# Patient Record
Sex: Female | Born: 1965 | Race: Black or African American | Hispanic: No | Marital: Married | Smoking: Never smoker
Health system: Southern US, Community
[De-identification: ages and names within clinical notes are randomized; demographics above are authoritative.]

## PROBLEM LIST (undated history)

## (undated) DIAGNOSIS — R112 Nausea with vomiting, unspecified: Secondary | ICD-10-CM

## (undated) DIAGNOSIS — Z9889 Other specified postprocedural states: Secondary | ICD-10-CM

## (undated) DIAGNOSIS — J111 Influenza due to unidentified influenza virus with other respiratory manifestations: Secondary | ICD-10-CM

## (undated) DIAGNOSIS — R600 Localized edema: Secondary | ICD-10-CM

## (undated) DIAGNOSIS — I8390 Asymptomatic varicose veins of unspecified lower extremity: Secondary | ICD-10-CM

## (undated) DIAGNOSIS — K469 Unspecified abdominal hernia without obstruction or gangrene: Secondary | ICD-10-CM

## (undated) HISTORY — DX: Localized edema: R60.0

## (undated) HISTORY — PX: HERNIA REPAIR: SHX51

## (undated) HISTORY — DX: Unspecified abdominal hernia without obstruction or gangrene: K46.9

## (undated) HISTORY — DX: Asymptomatic varicose veins of unspecified lower extremity: I83.90

## (undated) HISTORY — DX: Influenza due to unidentified influenza virus with other respiratory manifestations: J11.1

---

## 1998-02-12 ENCOUNTER — Emergency Department (HOSPITAL_COMMUNITY): Admission: EM | Admit: 1998-02-12 | Discharge: 1998-02-12 | Payer: Self-pay | Admitting: Emergency Medicine

## 1998-03-26 ENCOUNTER — Inpatient Hospital Stay (HOSPITAL_COMMUNITY): Admission: AD | Admit: 1998-03-26 | Discharge: 1998-03-26 | Payer: Self-pay | Admitting: *Deleted

## 1998-04-23 ENCOUNTER — Ambulatory Visit (HOSPITAL_COMMUNITY): Admission: RE | Admit: 1998-04-23 | Discharge: 1998-04-23 | Payer: Self-pay | Admitting: Obstetrics & Gynecology

## 1998-07-08 ENCOUNTER — Ambulatory Visit (HOSPITAL_COMMUNITY): Admission: RE | Admit: 1998-07-08 | Discharge: 1998-07-08 | Payer: Self-pay | Admitting: *Deleted

## 1998-07-29 ENCOUNTER — Inpatient Hospital Stay (HOSPITAL_COMMUNITY): Admission: AD | Admit: 1998-07-29 | Discharge: 1998-07-29 | Payer: Self-pay | Admitting: Obstetrics

## 1998-08-02 ENCOUNTER — Inpatient Hospital Stay (HOSPITAL_COMMUNITY): Admission: AD | Admit: 1998-08-02 | Discharge: 1998-08-07 | Payer: Self-pay | Admitting: Obstetrics & Gynecology

## 1998-08-04 ENCOUNTER — Encounter (INDEPENDENT_AMBULATORY_CARE_PROVIDER_SITE_OTHER): Payer: Self-pay | Admitting: Specialist

## 1998-08-13 ENCOUNTER — Inpatient Hospital Stay (HOSPITAL_COMMUNITY): Admission: AD | Admit: 1998-08-13 | Discharge: 1998-08-13 | Payer: Self-pay | Admitting: Obstetrics & Gynecology

## 1998-10-01 ENCOUNTER — Encounter (HOSPITAL_BASED_OUTPATIENT_CLINIC_OR_DEPARTMENT_OTHER): Payer: Self-pay | Admitting: General Surgery

## 1998-10-05 ENCOUNTER — Ambulatory Visit (HOSPITAL_COMMUNITY): Admission: RE | Admit: 1998-10-05 | Discharge: 1998-10-06 | Payer: Self-pay | Admitting: General Surgery

## 2000-12-26 ENCOUNTER — Encounter: Payer: Self-pay | Admitting: *Deleted

## 2000-12-26 ENCOUNTER — Ambulatory Visit (HOSPITAL_COMMUNITY): Admission: RE | Admit: 2000-12-26 | Discharge: 2000-12-26 | Payer: Self-pay | Admitting: *Deleted

## 2001-01-23 ENCOUNTER — Encounter (HOSPITAL_COMMUNITY): Admission: AD | Admit: 2001-01-23 | Discharge: 2001-02-22 | Payer: Self-pay | Admitting: *Deleted

## 2001-02-13 ENCOUNTER — Encounter: Payer: Self-pay | Admitting: *Deleted

## 2001-03-06 ENCOUNTER — Encounter (HOSPITAL_COMMUNITY): Admission: AD | Admit: 2001-03-06 | Discharge: 2001-04-05 | Payer: Self-pay | Admitting: *Deleted

## 2001-03-31 ENCOUNTER — Inpatient Hospital Stay (HOSPITAL_COMMUNITY): Admission: AD | Admit: 2001-03-31 | Discharge: 2001-03-31 | Payer: Self-pay | Admitting: *Deleted

## 2001-04-06 ENCOUNTER — Encounter (HOSPITAL_COMMUNITY): Admission: RE | Admit: 2001-04-06 | Discharge: 2001-05-06 | Payer: Self-pay | Admitting: *Deleted

## 2001-05-01 ENCOUNTER — Encounter: Payer: Self-pay | Admitting: *Deleted

## 2001-05-15 ENCOUNTER — Encounter (HOSPITAL_COMMUNITY): Admission: RE | Admit: 2001-05-15 | Discharge: 2001-06-14 | Payer: Self-pay | Admitting: *Deleted

## 2001-06-19 ENCOUNTER — Encounter (HOSPITAL_COMMUNITY): Admission: RE | Admit: 2001-06-19 | Discharge: 2001-06-29 | Payer: Self-pay | Admitting: *Deleted

## 2001-06-28 ENCOUNTER — Encounter: Payer: Self-pay | Admitting: *Deleted

## 2001-06-29 ENCOUNTER — Inpatient Hospital Stay (HOSPITAL_COMMUNITY): Admission: AD | Admit: 2001-06-29 | Discharge: 2001-07-02 | Payer: Self-pay | Admitting: *Deleted

## 2001-06-29 ENCOUNTER — Encounter (INDEPENDENT_AMBULATORY_CARE_PROVIDER_SITE_OTHER): Payer: Self-pay | Admitting: *Deleted

## 2001-07-05 ENCOUNTER — Inpatient Hospital Stay (HOSPITAL_COMMUNITY): Admission: AD | Admit: 2001-07-05 | Discharge: 2001-07-05 | Payer: Self-pay | Admitting: *Deleted

## 2001-08-21 ENCOUNTER — Inpatient Hospital Stay (HOSPITAL_COMMUNITY): Admission: AD | Admit: 2001-08-21 | Discharge: 2001-08-21 | Payer: Self-pay | Admitting: *Deleted

## 2001-08-21 ENCOUNTER — Encounter (INDEPENDENT_AMBULATORY_CARE_PROVIDER_SITE_OTHER): Payer: Self-pay | Admitting: *Deleted

## 2004-01-27 ENCOUNTER — Ambulatory Visit: Payer: Self-pay | Admitting: Nurse Practitioner

## 2004-03-15 ENCOUNTER — Ambulatory Visit: Payer: Self-pay | Admitting: Nurse Practitioner

## 2004-03-19 ENCOUNTER — Ambulatory Visit (HOSPITAL_COMMUNITY): Admission: RE | Admit: 2004-03-19 | Discharge: 2004-03-19 | Payer: Self-pay | Admitting: Internal Medicine

## 2004-03-24 ENCOUNTER — Ambulatory Visit (HOSPITAL_COMMUNITY): Admission: RE | Admit: 2004-03-24 | Discharge: 2004-03-24 | Payer: Self-pay | Admitting: Internal Medicine

## 2004-04-06 ENCOUNTER — Ambulatory Visit: Payer: Self-pay | Admitting: Nurse Practitioner

## 2004-04-12 ENCOUNTER — Ambulatory Visit (HOSPITAL_COMMUNITY): Admission: RE | Admit: 2004-04-12 | Discharge: 2004-04-12 | Payer: Self-pay | Admitting: Internal Medicine

## 2004-04-27 ENCOUNTER — Ambulatory Visit: Payer: Self-pay | Admitting: Nurse Practitioner

## 2004-05-03 ENCOUNTER — Ambulatory Visit: Payer: Self-pay | Admitting: *Deleted

## 2004-07-27 ENCOUNTER — Ambulatory Visit: Payer: Self-pay | Admitting: Nurse Practitioner

## 2005-07-01 ENCOUNTER — Ambulatory Visit: Payer: Self-pay | Admitting: Nurse Practitioner

## 2005-11-08 ENCOUNTER — Ambulatory Visit: Payer: Self-pay | Admitting: Nurse Practitioner

## 2005-11-08 LAB — CONVERTED CEMR LAB
RBC count: 4.49 10*6/uL
TSH: 1.31 microintl units/mL
WBC, blood: 5.1 10*3/uL

## 2005-11-15 ENCOUNTER — Ambulatory Visit: Payer: Self-pay | Admitting: Nurse Practitioner

## 2005-11-15 LAB — CONVERTED CEMR LAB: Pap Smear: NORMAL

## 2005-11-30 ENCOUNTER — Ambulatory Visit (HOSPITAL_COMMUNITY): Admission: RE | Admit: 2005-11-30 | Discharge: 2005-11-30 | Payer: Self-pay | Admitting: Family Medicine

## 2005-12-28 ENCOUNTER — Ambulatory Visit (HOSPITAL_COMMUNITY): Admission: RE | Admit: 2005-12-28 | Discharge: 2005-12-30 | Payer: Self-pay

## 2006-09-26 ENCOUNTER — Encounter: Payer: Self-pay | Admitting: Nurse Practitioner

## 2006-09-26 DIAGNOSIS — K432 Incisional hernia without obstruction or gangrene: Secondary | ICD-10-CM | POA: Insufficient documentation

## 2006-09-26 DIAGNOSIS — I8390 Asymptomatic varicose veins of unspecified lower extremity: Secondary | ICD-10-CM | POA: Insufficient documentation

## 2006-09-26 DIAGNOSIS — E785 Hyperlipidemia, unspecified: Secondary | ICD-10-CM | POA: Insufficient documentation

## 2006-10-25 ENCOUNTER — Encounter (INDEPENDENT_AMBULATORY_CARE_PROVIDER_SITE_OTHER): Payer: Self-pay | Admitting: *Deleted

## 2006-11-22 ENCOUNTER — Ambulatory Visit (HOSPITAL_COMMUNITY): Admission: RE | Admit: 2006-11-22 | Discharge: 2006-11-22 | Payer: Self-pay | Admitting: Internal Medicine

## 2006-11-22 ENCOUNTER — Ambulatory Visit: Payer: Self-pay | Admitting: Internal Medicine

## 2006-11-22 LAB — CONVERTED CEMR LAB
Chloride: 108 meq/L (ref 96–112)
Creatinine, Ser: 0.71 mg/dL (ref 0.40–1.20)
HDL: 56 mg/dL (ref >39)
Potassium: 3.9 meq/L (ref 3.5–5.3)
Sodium: 139 meq/L (ref 135–145)
Total CHOL/HDL Ratio: 3.1
Triglycerides: 114 mg/dL (ref <150)
VLDL: 23 mg/dL (ref 0–40)

## 2006-12-07 ENCOUNTER — Ambulatory Visit: Payer: Self-pay | Admitting: Internal Medicine

## 2006-12-18 ENCOUNTER — Ambulatory Visit (HOSPITAL_COMMUNITY): Admission: RE | Admit: 2006-12-18 | Discharge: 2006-12-18 | Payer: Self-pay | Admitting: Family Medicine

## 2007-04-27 ENCOUNTER — Ambulatory Visit: Payer: Self-pay | Admitting: Family Medicine

## 2007-06-26 ENCOUNTER — Ambulatory Visit: Payer: Self-pay | Admitting: Family Medicine

## 2010-06-25 NOTE — Discharge Summary (Signed)
[facility]  Patient:    Marcia Carter, TOPHAM Visit Number: [id number] MRN: [id number]          Service Type: OBS Location: 910A 9138 01 Attending Physician:  Michaelle Copas Dictated by:   Ed Blalock. Burnadette Peter, M.D. Admit Date:  06/29/2001 Discharge Date: 07/02/2001   CC:         Luisa Hart L. Lurene Shadow, M.D.  [facility]   Discharge Summary  DATE OF BIRTH:                1965-11-03  HISTORY OF PRESENT ILLNESS:   This 45 year old G4, P3-0-0-3 presented at 36-6/[redacted] weeks gestation, scheduled for repeat C-section and BTL.  She had a routine pregnancy with no complications.  MEDICATIONS:                  Prenatal vitamins.  ALLERGIES:                    Penicillin.  PAST MEDICAL HISTORY:         1. Previous C-section.                               2. Ventral hernia repair in August 2000.  REVIEW OF SYSTEMS:            Headaches and venous insufficiency in the right leg.  FAMILY HISTORY:               Significant for diabetes mellitus.  PHYSICAL EXAMINATION:  VITAL SIGNS:                  Stable and afebrile.  GENERAL:                      Normal exam with no abnormalities noted. Vaginal exam was deferred due to scheduled C-section.  HOSPITAL COURSE:              The patient underwent C-section for scheduled repeat.  She also underwent bilateral tubal ligation at request.  TXI and Medicaid papers on the chart at that time.  C-section was performed by Dr. Perlie Gold and Dr. Gavin Potters with ventral hernia repair by Dr. Lurene Shadow.  She was delivered of a viable female with Apgars of 9 and 9.  She had a routine postoperative course and, on postoperative day #3, was discharged to home with routine follow-up instructions.  DISCHARGE ACTIVITY:           Pelvic rest and ad lib.  DISCHARGE MEDICATIONS:        1. Percocet 5/325, one p.o. q.4h. p.r.n. pain.                               2. Ibuprofen 600 mg, one p.o. q.6h. p.r.n. pain.                  3. Prenatal vitamins, one p.o. q.d.                               4. Over-the-counter stool softener, one p.o.                                  b.i.d. p.r.n.  DISCHARGE FOLLOW UP:  She is to be seen in the maternity admissions unit on Thursday, Jul 05, 2001 for re-evaluation of her incision and staple removal.  She is to be seen within one week at Dr. Levi Aland office.  The number was provided.  She is to call for an appointment.  She is to be seen in six week in follow up for [facility]  CONDITION ON DISCHARGE:       Improved.  DISCHARGE DIAGNOSES:          1. Intrauterine pregnancy at term.                               2. Repeat elective cesarean section.                               3. Bilateral tubal ligation.                               4. Routine postoperative care.                               5. Ventral hernia repair with routine                                  postoperative care. Dictated by:   Ed Blalock. Burnadette Peter, M.D. Attending Physician:  Michaelle Copas DD:  07/02/01 TD:  07/04/01 Job: [id number]

## 2010-06-25 NOTE — Op Note (Signed)
Marcia Carter, Marcia Carter              ACCOUNT NO.:  [id number]   MEDICAL RECORD NO.:  [id number]          PATIENT TYPE:  OIB   LOCATION:  5735                         FACILITY:  [facility]   PHYSICIAN:  Lebron Conners, M.D.   DATE OF BIRTH:  1965/10/17   DATE OF PROCEDURE:  12/28/2005  DATE OF DISCHARGE:                                 OPERATIVE REPORT   PREOPERATIVE DIAGNOSIS:  Incisional hernia.   POSTOPERATIVE DIAGNOSIS:  Incisional hernia.   PROCEDURE PERFORMED:  Laparoscopic repair of incisional hernia.   SURGEON:  Lebron Conners, M.D.   ANESTHESIA:  General.   DESCRIPTION OF PROCEDURE:  After the patient was monitored and asleep and  had a Foley catheter in place and routine preparation and draping of the  abdomen, I made about a 3 cm incision in the left upper quadrant far  laterally near the rib margin and dissected down through the fat to the  muscles, and I cut the muscles and spread the muscles down to the  transversalis fascia and peritoneum, and I carefully entered the abdomen  sharply and dilated the track with my finger.  I placed a 0 Vicryl  pursestring suture in the fascia and secured a Hasson cannula and then  inflated the abdomen with carbon dioxide.  I put in a scope and saw  adhesions and a hernia in the midline as expected.  The remainder of the  abdominal cavity was generally free of adhesions, and I saw no other  abnormalities.   I then put in another 10 mm port and a 5 mm port along the left side,  putting them in under direct view and assuring that I made no injury to the  viscera when I put them in.  I thin took down the adhesions and reduced the  hernia with cautery and sharp dissection and traction.  There was a great  deal of omentum trapped in the hernias.  There were multiple hernias; I  encountered at least four defects along the midline, the lowest being at the  umbilicus, and several cephalad to that.  There was some small intestine  adherent to the  anterior abdominal wall, but not in the hernia sacs.  After  taking down all the adhesions and hernia, I very carefully inspected the  abdominal contents and I saw no evident injury to any bowel or any  persistent bleeding.  All of the omentum that was pulled out appeared to be  viable.   I then measured on the abdominal wall the area necessary to cover all the  defects and selected a piece of Parietex mesh with a slick backing measuring  20 x 30 cm.  I trimmed it slightly to make it oval, and then placed seven  tacking sutures in it, and then wet the mesh.  I marked it for orientation  and rolled it, and put it into the abdomen.  I then unrolled it and it lay  quite nicely in the orientation I desired.  I then used a suture capturer to  pull through each of the seven tacking sutures and tied them  all, and it  brought the mesh up nicely to the anterior abdominal wall with minimal  redundancy.  I then used a Pro-Tacker to further secure the mesh all the  away around and toward the middle as well to further reduce the redundancy  and provide secure closure all the way around the outside so that no hernia  could occur at the edges.  I felt that the mesh covered all of the hernia  defects very widely and was securely in place so that it was an adequate  repair.  I again checked the viscera and saw no problems.   I removed the extra ports that I had put in on the left side under direct  view, and removed the large port in the left lower quadrant under direct  view.  Then I allowed the carbon dioxide to escape and removed the Hasson  cannula and tied the pursestring suture.  I did a little further  reinforcement of the closure of the fascia and that incision, and  then I was satisfied that no hernia would occur there.  Sponge, needle and  instrument counts were correct.  Blood loss was minimal.  I closed the skin  incisions with intracuticular 4-0 Vicryl and Steri-Strips; and applied a   compressive bandage.  The patient tolerated the operation well.      Lebron Conners, M.D.  Electronically Signed     WB/MEDQ  D:  12/28/2005  T:  12/29/2005  Job:  [id number]

## 2010-06-25 NOTE — Op Note (Signed)
[facility]  Patient:    Marcia Carter, Marcia Carter Visit Number: [id number] MRN: [id number]          Service Type: OBS Location: 910A 9138 01 Attending Physician:  Michaelle Copas Dictated by:   Mardene Celeste. Lurene Shadow, M.D. Proc. Date: 06/29/01 Admit Date:  06/29/2001 Discharge Date: 07/02/2001   CC:         Luisa Hart L. Lurene Shadow, M.D. (2 copies)   Operative Report  PREOPERATIVE DIAGNOSIS:       Recurrent ventral hernia.  POSTOPERATIVE DIAGNOSIS:      Recurrent ventral hernia.  OPERATION:                    Repair of ventral hernia with mesh.  SURGEON:                      Mardene Celeste. Lurene Shadow, M.D.  ASSISTANT:                    Clement Husbands, M.D.  ANESTHESIA:                   Spinal.  INDICATIONS:                  This patient is a 45 year old [place] woman who is in the operating room today for an elective repeat cesarean section and for repair of a recurrent ventral hernia.  The patient had full knowledge of her surgery. The risks and benefits have been gone over with her in detail, and she has given consent to ventral hernia repair.  DESCRIPTION OF PROCEDURE:     At the time I entered the operating room, the patient wass having her C section completed, and I wa able to palpate the defect which was up high in the epigastrium from the C section wound.  I determined that an upper abdominal incision would be necessary to repair this. After C section was completed, I then removed the drapes, reprepped the abdomen.  An upper midline incision was then made through the old scar cicatrix,  deepened this through the skin and subcutaneous tissue, carrying dissection down to the hernia defect.  The hernial sac was opened and amputated.  There were adhesions around the defect, and portions of omentum and falciform ligament were taken down between clamps and secured with ties of silk.  I then closed the peritoneum overlying the defect with a  running suture of 2-0 Vicryl.  I then undermined the surrounding tissues so as to be able to put an onlay patch of mesh overlying the entire area of weakness.  This was sewn in with interrupted sutures of 0 Novofil in mattress fashion.  At the end of this, the defect was noted to be well repaired.  Sponge, instrument, and sharp counts were verified.  The subcutaneous tissue was closed with a running 3-0 Vicryl suture, skin closed with a running 4-0 Monocryl suture and then reinforced with Steri-Strips.  Sterile dressings applied.  Anesthetic was reversed, patient removed from the operating room to the recovery room in stable condition.  She tolerated the procedure well. Dictated by:   Mardene Celeste. Lurene Shadow, M.D. Attending Physician:  Michaelle Copas DD:  06/29/01 TD:  07/02/01 Job: [id number]

## 2010-06-25 NOTE — Op Note (Signed)
[facility]  Patient:    Marcia Carter, Marcia Carter Visit Number: [id number] MRN: [id number]          Service Type: OBS Location: 910A 9138 01 Attending Physician:  Michaelle Copas Dictated by:   Clement Husbands, M.D. Proc. Date: 06/29/01 Admit Date:  06/29/2001 Discharge Date: 07/02/2001                             Operative Report  PREOPERATIVE DIAGNOSES:       1. Repeat cesarean section.                               2. Requested sterilization.                               3. Ventral abdominal hernia.  POSTOPERATIVE DIAGNOSES:      1. Repeat cesarean section.                               2. Requested sterilization.                               3. Ventral abdominal hernia.  OPERATION:                   1. Repeat low transverse cervical cesarean                                  section.                               2. Bilateral tubal ligation with partial                                  salpingectomy.                               3. Repair of ventral hernia.  ASSISTANT:                    Clement Husbands, M.D.                               Conni Elliot, M.D.                                [name], M.D. repaired the hernia                               and will dictate separately.  ANESTHESIA:                   Spinal.  DESCRIPTION OF PROCEDURE:     With the patient under satisfactory spinal anesthesia, the abdomen was prepped, bladder catheterized, abdomen prepped and draped.  Low abdominal transverse skin incision was made and carried  down through the thick subcutaneous tissue to the rectus fascia which was transversely divided.  The peritoneal cavity was entered.  The vesicouterine peritoneum was transversely incised.  Because the patient had not been in labor, the lower anterior segment was quite thick.  Sharp dissection about two-thirds of the way through was done.  There was brisk bleeding at  that point, so my index finger was bluntly pushed on into the uterine cavity, and uterine incision extended laterally.  The patient was delivered of a female infant in satisfactory condition.  I do not know the Apgars at this time or the weight.  Cord was doubly clamped and divided.  The infant was shown to the parents and then passed on to pediatricians.  The bleeding sites on the edges of the incision were clamped with ring forceps.  The placenta was manually removed.  The uterus was manually explored and was negative.  The internal os was dilated.  Intravenous Pitocin drip was running.  Pitocin was also injected into the myometrium.  The uterine incision was closed with two segments of running 0 Vicryl suture. A second layer imbricated the first.  Additional bleeding sites required figure-of-eight suturing.  Suture line was irrigated.  Hemostasis was good. Vesicouterine peritoneum was approximated with running 2-0 Vicryl suture.  The site was irrigated.  Both fallopian tubes were then visualized.  The mid portion of both fallopian tubes were doubly ligated with 0 chromic catgut and then ligated segment excised.  The uterus, which had been left out of the abdominal cavity, was replaced.  Both tubal sites were reinspected.  Lower area was inspected. Hemostasis was good.  The anterior peritoneum was closed with a running 0 Vicryl suture.  Rectus fascia closed with running 2-0 Vicryl suture using two segments.  Hemostasis was good.  Skin edges were approximated with wide skin staples.  Measured blood loss was at least 1000 cc.  Sponge and needle instrument counts were correct.  The patient tolerated the procedure well.  As mentioned above, Dr. Lurene Shadow will dictate his part of the procedure. Dictated by:   Clement Husbands, M.D. Attending Physician:  Michaelle Copas DD:  06/29/01 TD:  07/02/01 Job: [id number]

## 2015-05-27 ENCOUNTER — Other Ambulatory Visit: Payer: Self-pay | Admitting: *Deleted

## 2015-05-27 ENCOUNTER — Encounter: Payer: Self-pay | Admitting: Vascular Surgery

## 2015-05-27 DIAGNOSIS — I83893 Varicose veins of bilateral lower extremities with other complications: Secondary | ICD-10-CM

## 2015-06-02 ENCOUNTER — Encounter: Payer: Self-pay | Admitting: Surgery

## 2015-06-02 ENCOUNTER — Other Ambulatory Visit: Payer: Self-pay | Admitting: Surgery

## 2015-06-02 NOTE — H&P (Signed)
Marcia Carter 06/02/2015 11:29 AM Location: [facility] Patient #: [id number] DOB: 03/10/1965 Married / Language: English / Race: Black or African American Female  History of Present Illness Ardeth Sportsman MD; 06/02/2015 12:11 PM) The patient is a 50 year old female who presents with an incisional hernia. Note for "Incisional hernia": Patient comes for surgical consultation at the request of her primary care physician, Dr. Boneta Lucks, [facility]. Concern for recurrent abdominal hernia  Very pleasant woman. Both our children go to the same elementary school. 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 Last C/S in 2000. Primarily repaired. Recurrent. Open repair with mesh 2003 with hysterectomy. Recurred. Laparoscopic underlay repair with Parietex mesh 2007. Did rather well. No episodes of obstruction. No nausea or vomiting. Mild constipation usually controlled with high fiber diet. Portion patient had episode of severe coughing with a cold. Congestion and bronchitis. She does not smoke. Eventually improved. However she noted some bulging in now discomfort on the left side of her abdomen. Not in the midline as before. However symptoms and bulging suspicious for another hernia. She was concerned. Primary care physician concerning for recurrent hernia. Therefore surgical consultation requested.   Past Surgical History Ardeth Sportsman, MD; 06/02/2015 11:40 AM) Ventral Hernia Repair 12/28/2005 Dr Marcy Panning. Parietex 30x20cm Ventral / Umbilical Hernia Surgery 07/02/2001 open VWH repair w mesh. Dr Dewaine Conger / Inguinal Hernia Surgery - Multiple times  Allergies Fay Records, CMA; 06/02/2015 11:29 AM) No Known Drug Allergies 06/02/2015  Medication History Fay Records, CMA; 06/02/2015 11:29 AM) No Current Medications Medications Reconciled  Travel History Ardeth Sportsman, MD;  06/02/2015 12:08 PM) originally from [place]. Has lived in [place] several decades.     Review of Systems Fay Records CMA; 06/02/2015 11:29 AM) General Not Present- Appetite Loss, Chills, Fatigue, Fever, Night Sweats, Weight Gain and Weight Loss. Skin Not Present- Change in Wart/Mole, Dryness, Hives, Jaundice, New Lesions, Non-Healing Wounds, Rash and Ulcer. HEENT Not Present- Earache, Hearing Loss, Hoarseness, Nose Bleed, Oral Ulcers, Ringing in the Ears, Seasonal Allergies, Sinus Pain, Sore Throat, Visual Disturbances, Wears glasses/contact lenses and Yellow Eyes. Cardiovascular Not Present- Chest Pain, Difficulty Breathing Lying Down, Leg Cramps, Palpitations, Rapid Heart Rate, Shortness of Breath and Swelling of Extremities. Gastrointestinal Not Present- Abdominal Pain, Bloating, Bloody Stool, Change in Bowel Habits, Chronic diarrhea, Constipation, Difficulty Swallowing, Excessive gas, Gets full quickly at meals, Hemorrhoids, Indigestion, Nausea, Rectal Pain and Vomiting. Female Genitourinary Not Present- Frequency, Nocturia, Painful Urination, Pelvic Pain and Urgency. Musculoskeletal Present- Swelling of Extremities. Not Present- Back Pain, Joint Pain, Joint Stiffness, Muscle Pain and Muscle Weakness. Neurological Present- Numbness. Not Present- Decreased Memory, Fainting, Headaches, Seizures, Tingling, Tremor, Trouble walking and Weakness. Psychiatric Not Present- Anxiety, Bipolar, Change in Sleep Pattern, Depression, Fearful and Frequent crying. Hematology Not Present- Easy Bruising, Excessive bleeding, Gland problems, HIV and Persistent Infections.  Vitals Fay Records CMA; 06/02/2015 11:30 AM) 06/02/2015 11:29 AM Weight: 198.8 lb Height: 64in Body Surface Area: 1.95 m Body Mass Index: 34.12 kg/m  Temp.: 98.67F  Pulse: 74 (Regular)  BP: 126/84 (Sitting, Left Arm, Standard)      Physical Exam Ardeth Sportsman MD; 06/02/2015 12:06 PM)  General Mental  Status-Alert. General Appearance-Not in acute distress, Not Sickly. Orientation-Oriented X3. Hydration-Well hydrated. Voice-Normal.  Integumentary Global Assessment Upon inspection and palpation of skin surfaces of the - Axillae: non-tender, no inflammation or ulceration, no drainage. and Distribution of scalp and body  hair is normal. General Characteristics Temperature - normal warmth is noted.  Head and Neck Head-normocephalic, atraumatic with no lesions or palpable masses. Face Global Assessment - atraumatic, no absence of expression. Neck Global Assessment - no abnormal movements, no bruit auscultated on the right, no bruit auscultated on the left, no decreased range of motion, non-tender. Trachea-midline. Thyroid Gland Characteristics - non-tender.  Eye Eyeball - Left-Extraocular movements intact, No Nystagmus. Eyeball - Right-Extraocular movements intact, No Nystagmus. Cornea - Left-No Hazy. Cornea - Right-No Hazy. Sclera/Conjunctiva - Left-No scleral icterus, No Discharge. Sclera/Conjunctiva - Right-No scleral icterus, No Discharge. Pupil - Left-Direct reaction to light normal. Pupil - Right-Direct reaction to light normal.  ENMT Ears Pinna - Left - no drainage observed, no generalized tenderness observed. Right - no drainage observed, no generalized tenderness observed. Nose and Sinuses External Inspection of the Nose - no destructive lesion observed. Inspection of the nares - Left - quiet respiration. Right - quiet respiration. Mouth and Throat Lips - Upper Lip - no fissures observed, no pallor noted. Lower Lip - no fissures observed, no pallor noted. Nasopharynx - no discharge present. Oral Cavity/Oropharynx - Tongue - no dryness observed. Oral Mucosa - no cyanosis observed. Hypopharynx - no evidence of airway distress observed.  Chest and Lung Exam Inspection Movements - Normal and Symmetrical. Accessory muscles - No use of accessory  muscles in breathing. Palpation Palpation of the chest reveals - Non-tender. Auscultation Breath sounds - Normal and Clear.  Cardiovascular Auscultation Rhythm - Regular. Murmurs & Other Heart Sounds - Auscultation of the heart reveals - No Murmurs and No Systolic Clicks.  Abdomen Inspection Inspection of the abdomen reveals - No Visible peristalsis and No Abnormal pulsations. Umbilicus - No Bleeding, No Urine drainage. Palpation/Percussion Palpation and Percussion of the abdomen reveal - Soft, Non Tender, No Rebound tenderness, No Rigidity (guarding) and No Cutaneous hyperesthesia. Note: Obese but soft. Midline umbilical and infraumbilical region without hernia recurrence. Left paramedian bulging. Left lower quadrant and upper aspect of panniculus. 6 x 5 cm region. Sensitive but reducible. Consistent with hernia.  Female Genitourinary Sexual Maturity Tanner 5 - Adult hair pattern. Note: No inguinal hernias. No vaginal bleeding nor discharge  Peripheral Vascular Upper Extremity Inspection - Left - No Cyanotic nailbeds, Not Ischemic. Right - No Cyanotic nailbeds, Not Ischemic.  Neurologic Neurologic evaluation reveals -normal attention span and ability to concentrate, able to name objects and repeat phrases. Appropriate fund of knowledge , normal sensation and normal coordination. Mental Status Affect - not angry, not paranoid. Cranial Nerves-Normal Bilaterally. Gait-Normal.  Neuropsychiatric Mental status exam performed with findings of-able to articulate well with normal speech/language, rate, volume and coherence, thought content normal with ability to perform basic computations and apply abstract reasoning and no evidence of hallucinations, delusions, obsessions or homicidal/suicidal ideation.  Musculoskeletal Global Assessment Spine, Ribs and Pelvis - no instability, subluxation or laxity. Right Upper Extremity - no instability, subluxation or  laxity.  Lymphatic Head & Neck  General Head & Neck Lymphatics: Bilateral - Description - No Localized lymphadenopathy. Axillary  General Axillary Region: Bilateral - Description - No Localized lymphadenopathy. Femoral & Inguinal  Generalized Femoral & Inguinal Lymphatics: Left - Description - No Localized lymphadenopathy. Right - Description - No Localized lymphadenopathy.    Assessment & Plan Ardeth Sportsman(Janazia Schreier C. 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 Rather lateral. I think she would benefit from repair. I would do laparoscopic underlay reproach. Would  use polypropylene since Parietex last time. Large sheet to avoid recurrence. Too large for primary repair and that will fail. Mesh will lower the chance of recurrence.  Increased risk of prolonged lysis of adhesions or other complications. However natural history risk worse. She's tried intentionally to lose weight but pain limits her activity level. She does not smoke. She is not diabetic. Hopefully we can get her through this safely. She is interested in proceeding.  She would like until after school is over & after Ramadan. Therefore in July. I will be gone part of that month but we will ultimately trying to find a time that works for her and me.  ENCOUNTER FOR PRE-OPERATIVE EXAMINATION - VWH (Z61.096)  Current Plans You are being scheduled for surgery - Our schedulers will call you.  You should hear from our office's scheduling department within 5 working days about the location, date, and time of surgery. We try to make accommodations for patient's preferences in scheduling surgery, but sometimes the OR schedule or the surgeon's schedule prevents [place] from making those accommodations.  If you have not heard from our office [phone]) in 5 working days, call the office and ask for your surgeon's nurse.  If you have  other questions about your diagnosis, plan, or surgery, call the office and ask for your surgeon's nurse.  Written instructions provided The anatomy & physiology of the abdominal wall was discussed. The pathophysiology of hernias was discussed. Natural history risks without surgery including progeressive enlargement, pain, incarceration, & strangulation was discussed. Contributors to complications such as smoking, obesity, diabetes, prior surgery, etc were discussed.  I feel the risks of no intervention will lead to serious problems that outweigh the operative risks; therefore, I recommended surgery to reduce and repair the hernia. I explained laparoscopic techniques with possible need for an open approach. I noted the probable use of mesh to patch and/or buttress the hernia repair  Risks such as bleeding, infection, abscess, need for further treatment, heart attack, death, and other risks were discussed. I noted a good likelihood this will help address the problem. Goals of post-operative recovery were discussed as well. Possibility that this will not correct all symptoms was explained. I stressed the importance of low-impact activity, aggressive pain control, avoiding constipation, & not pushing through pain to minimize risk of post-operative chronic pain or injury. Possibility of reherniation especially with smoking, obesity, diabetes, immunosuppression, and other health conditions was discussed. We will work to minimize complications.  An educational handout further explaining the pathology & treatment options was given as well. Questions were answered. The patient expresses understanding & wishes to proceed with surgery.  Pt Education - Pamphlet Given - Laparoscopic Hernia Repair: discussed with patient and provided information. Pt Education - [facility] Hernia Post-Op HCI (Kiarra Kidd): discussed with patient and provided information. Pt Education - [facility] Pain Control (Seniyah Esker)  Ardeth Sportsman, M.D.,  F.A.C.S. Gastrointestinal and Minimally Invasive Surgery [facility] [address], [address] [place], [place] [address] [phone] Main / Paging

## 2015-08-14 ENCOUNTER — Encounter: Payer: Self-pay | Admitting: Vascular Surgery

## 2015-08-20 ENCOUNTER — Ambulatory Visit (HOSPITAL_COMMUNITY)
Admission: RE | Admit: 2015-08-20 | Discharge: 2015-08-20 | Disposition: A | Discharge status: Home / Self Care | Payer: BC Managed Care – PPO | Source: Ambulatory Visit | Attending: Vascular Surgery | Admitting: Vascular Surgery

## 2015-08-20 ENCOUNTER — Encounter: Payer: Self-pay | Admitting: Vascular Surgery

## 2015-08-20 ENCOUNTER — Ambulatory Visit (INDEPENDENT_AMBULATORY_CARE_PROVIDER_SITE_OTHER): Payer: BC Managed Care – PPO | Admitting: Vascular Surgery

## 2015-08-20 VITALS — BP 130/76 | HR 80 | Temp 97.0°F | Resp 16 | Ht 64.5 in | Wt 186.0 lb

## 2015-08-20 DIAGNOSIS — I83893 Varicose veins of bilateral lower extremities with other complications: Secondary | ICD-10-CM | POA: Diagnosis not present

## 2015-08-20 DIAGNOSIS — I83813 Varicose veins of bilateral lower extremities with pain: Secondary | ICD-10-CM

## 2015-08-20 NOTE — Progress Notes (Signed)
[facility]  Patient name: Marcia Carter MRN: [id number] DOB: 1966-01-03 Sex: female  REASON FOR CONSULT: Varicose veins  HPI: Marcia Carter is a 50 y.o. female, who presents for evaluation of bilateral leg swelling and varicose veins. The patient reports intermittent bleeding from a right shin varicosity approximately 3 months ago. The patient says that her right leg is more painful and bothersome than the left. She has been dealing with varicose veins for many years. She denies a history of DVT. 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 She is not 1 compression stockings before.  She is planning on undergoing abdominal hernia repair at the end of this month. She has had 2 prior hernia repairs in the same area in the past. She is not on anticoagulation. She is otherwise healthy without significant medical issues. She is not on blood thinners.  Past Medical History  Diagnosis Date  . Varicose veins   . Bilateral lower extremity edema   . Abdominal hernia   . Influenza     No family history on file.  SOCIAL HISTORY: Social History   Social History  . Marital Status: Married    Spouse Name: N/A  . Number of Children: N/A  . Years of Education: N/A   Occupational History  . Not on file.   Social History Main Topics  . Smoking status: Never Smoker   . Smokeless tobacco: Not on file  . Alcohol Use: No  . Drug Use: No  . Sexual Activity: Not on file   Other Topics Concern  . Not on file   Social History Narrative   Originally from [place].  Lived in [place] several decades   Arabic translator    Allergies  Allergen Reactions  . Penicillins     Current Outpatient Prescriptions  Medication Sig Dispense Refill  . benzonatate (TESSALON) 100 MG capsule Take by mouth 3 (three) times daily as needed for cough.     No current facility-administered medications for this visit.    REVIEW OF SYSTEMS:    denotes positive finding,  denotes negative finding Cardiac  Comments:  Chest pain or chest pressure:    Shortness of breath upon exertion:    Short of breath when lying flat:    Irregular heart rhythm:        Vascular    Pain in calf, thigh, or hip brought on by ambulation:    Pain in feet at night that wakes you up from your sleep:     Blood clot in your veins:    Leg swelling:  x       Pulmonary    Oxygen at home:    Productive cough:     Wheezing:         Neurologic    Sudden weakness in arms or legs:  x   Sudden numbness in arms or legs:  x   Sudden onset of difficulty speaking or slurred speech:    Temporary loss of vision in one eye:     Problems with dizziness:         Gastrointestinal    Blood in stool:     Vomited blood:         Genitourinary    Burning when urinating:     Blood in urine:        Psychiatric    Major depression:         Hematologic  Bleeding problems:    Problems with blood clotting too easily:        Skin    Rashes or ulcers:        Constitutional    Fever or chills:      PHYSICAL EXAM: Filed Vitals:   08/20/15 1504  BP: 130/76  Pulse: 80  Temp: 97 F (36.1 C)  TempSrc: Oral  Resp: 16  Height: 5' 4.5" (1.638 m)  Weight: 186 lb (84.369 kg)  SpO2: 98%    GENERAL: The patient is a well-nourished female, in no acute distress. The vital signs are documented above. CARDIAC: There is a regular rate and rhythm. No carotid bruits. VASCULAR: 2+ femoral and dorsalis pedis pulses bilaterally. Large tortuous 7-8 mm varicosity to right anterior shin and dorsum right foot. Ecchymosis present to right shin from prior bleeding episode. Smaller varicosity to right posterior calf. Easily palpable saphenous vein right medial thigh. Smaller varicosities to left anterior shin. PULMONARY: There is good air exchange bilaterally without wheezing or rales. MUSCULOSKELETAL: There are no major deformities or cyanosis. NEUROLOGIC: No focal weakness  or paresthesias are detected. SKIN: There are no ulcers or rashes noted. PSYCHIATRIC: The patient has a normal affect.  DATA:  Lower extremity venous reflux evaluation 08/20/2015  Right: Negative for DVT. Deep venous reflux present. The great saphenous vein is dilated with diameters of 0.79 at the proximal calf, 0.99 cm at the knee and 1.14 cm at the saphenofemoral junction.  Left: Negative for DVT. Negative for deep and superficial venous reflux.  MEDICAL ISSUES: Right lower extremity varicose veins with complications  The patient has evidence of deep and superficial venous reflux on the right. She has a large tortuous varicosity to the right shin in which she has experienced intermittent bleeding episodes. She also has significant swelling to her right leg. The patient has not worn compression in the past. She was given a prescription for thigh-high 20-30 mmHg compression stockings. If the patient fails conservative therapy, would recommend laser ablation of the right great saphenous vein. She will be undergoing abdominal hernia repair at the end of this month.  Discussed that she needs to undergo 3 months trial of compression stockings prior to being considered for laser ablation. She would like to discuss things with her husband prior to considering any venous interventions.   Maris BergerKimberly Trinh, PA-C [facility] [place] [phone]   History and exam details as above. Patient has significant deep and superficial venous reflux on the right side with vein diameters over 8 mm. She has had one area that has now bled twice. She has not worn compression in the past and she was prescribed compression stockings today. I discussed with her possibility of laser ablation of the right greater saphenous vein. She will think about whether or not she wishes to have this done in the future. She will follow-up with us on as-needed basis.   Fabienne Brunsharles Fields, MD [facility] [place] Office: [phone] Pager: [phone]

## 2015-09-04 NOTE — Patient Instructions (Signed)
Marcia Carter  09/04/2015   Your procedure is scheduled on: 09/09/15  Report to [facility] Main  Entrance take [facility]  elevators to 3rd floor to  Short Stay Center at 11:30  AM.  Call this number if you have problems the morning of surgery [phone]   Remember: ONLY 1 PERSON MAY GO WITH YOU TO SHORT STAY TO GET  READY MORNING OF YOUR SURGERY.  Do not eat food AFTER MIDNIGHT--MAY HAVE CLEAR LIQUIDS MORNING OF SURGERY UNTIL 07:30 AM--THEN NOTHING BY MOUTH     Take these medicines the morning of surgery with A SIP OF WATER:  DO NOT TAKE ANY DIABETIC MEDICATIONS DAY OF YOUR SURGERY                               You may not have any metal on your body including hair pins and              piercings  Do not wear jewelry, make-up, lotions, powders or perfumes, deodorant             Do not wear nail polish.  Do not shave  48 hours prior to surgery.              Men may shave face and neck.   Do not bring valuables to the hospital. [facility] IS NOT             RESPONSIBLE   FOR VALUABLES.  Contacts, dentures or bridgework may not be worn into surgery.  Leave suitcase in the car. After surgery it may be brought to your room.             [place] - Preparing for Surgery Before surgery, you can play an important role.  Because skin is not sterile, your skin needs to be as free of germs as possible.  You can reduce the number of germs on your skin by washing with CHG (chlorahexidine gluconate) soap before surgery.  CHG is an antiseptic cleaner which kills germs and bonds with the skin to continue killing germs even after washing. Please DO NOT use if you have an allergy to CHG or antibacterial soaps.  If your skin becomes reddened/irritated stop using the CHG and inform your nurse when you arrive at Short Stay. Do not shave (including legs and underarms) for at least 48 hours prior to the first CHG shower.  You may shave your face/neck. Please follow these  instructions carefully:  1.  Shower with CHG Soap the night before surgery and the  morning of Surgery.  2.  If you choose to wash your hair, wash your hair first as usual with your  normal  shampoo.  3.  After you shampoo, rinse your hair and body thoroughly to remove the  shampoo.                           4.  Use CHG as you would any other liquid soap.  You can apply chg directly  to the skin and wash                       Gently with a scrungie or clean washcloth.  5.  Apply the CHG Soap to your body ONLY FROM THE NECK DOWN.  Do not use on face/ open                           Wound or open sores. Avoid contact with eyes, ears mouth and genitals (private parts).                       Wash face,  Genitals (private parts) with your normal soap.             6.  Wash thoroughly, paying special attention to the area where your surgery  will be performed.  7.  Thoroughly rinse your body with warm water from the neck down.  8.  DO NOT shower/wash with your normal soap after using and rinsing off  the CHG Soap.                9.  Pat yourself dry with a clean towel.            10.  Wear clean pajamas.            11.  Place clean sheets on your bed the night of your first shower and do not  sleep with pets. Day of Surgery : Do not apply any lotions/deodorants the morning of surgery.  Please wear clean clothes to the hospital/surgery center.  FAILURE TO FOLLOW THESE INSTRUCTIONS MAY RESULT IN THE CANCELLATION OF YOUR SURGERY PATIENT SIGNATURE_________________________________  NURSE SIGNATURE__________________________________  ________________________________________________________________________    CLEAR LIQUID DIET   Foods Allowed                                                                     Foods Excluded  Coffee and tea, regular and decaf                             liquids that you cannot  Plain Jell-O in any flavor                                             see through such  as: Fruit ices (not with fruit pulp)                                     milk, soups, orange juice  Iced Popsicles                                    All solid food Carbonated beverages, regular and diet                                    Cranberry, grape and apple juices Sports drinks like Gatorade Lightly seasoned clear broth or consume(fat free) Sugar, honey syrup  Sample Menu Breakfast  Lunch                                     Supper Cranberry juice                    Beef broth                            Chicken broth Jell-O                                     Grape juice                           Apple juice Coffee or tea                        Jell-O                                      Popsicle                                                Coffee or tea                        Coffee or tea  _____________________________________________________________________

## 2015-09-06 ENCOUNTER — Ambulatory Visit: Payer: Self-pay | Admitting: Surgery

## 2015-09-06 NOTE — H&P (Signed)
Marcia Carter 06/02/2015 11:29 AM [facility] Patient #: [id number] DOB: 03-21-1965 Married / Language: English / Race: Black or African American Female    History of Present Illness  The patient is a 50 year old female who presents with an incisional hernia. Note for "Incisional hernia": Patient comes for surgical consultation at the request of her primary care physician, Boneta Lucks NP, [facility]. Concern for recurrent abdominal hernia  Very pleasant woman. Both our children go to the same elementary school. 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 Last C/S in 2000. Primarily repaired. Recurrent. Open repair with mesh 2003 with hysterectomy. Recurred. Laparoscopic underlay repair with Parietex mesh 2007. Did rather well. No episodes of obstruction. No nausea or vomiting. Mild constipation usually controlled with high fiber diet. Portion patient had episode of severe coughing with a cold. Congestion and bronchitis. She does not smoke. Eventually improved. However she noted some bulging in now discomfort on the left side of her abdomen. Not in the midline as before. However symptoms and bulging suspicious for another hernia. She was concerned. Primary care physician concerning for recurrent hernia. Therefore surgical consultation requested.   Past Surgical History Ardeth Sportsman, MD; 06/02/2015 11:40 AM) Ventral Hernia Repair11/21/2007 Dr Marcy Panning. Parietex 30x20cm Ventral / Umbilical Hernia Surgery05/26/2003 open VWH repair w mesh. Dr Dewaine Conger / Inguinal Hernia Surgery - Multiple times  Allergies Fay Records, CMA; 06/02/2015 11:29 AM) No Known Drug Allergies04/25/2017  Medication History Fay Records, CMA; 06/02/2015 11:29 AM) No Current Medications Medications Reconciled  Travel History Ardeth Sportsman, MD; 06/02/2015 12:08 PM) originally from [place].  Has lived in [place] several decades.    Review of Systems Fay Records CMA; 06/02/2015 11:29 AM) General Not Present- Appetite Loss, Chills, Fatigue, Fever, Night Sweats, Weight Gain and Weight Loss. Skin Not Present- Change in Wart/Mole, Dryness, Hives, Jaundice, New Lesions, Non-Healing Wounds, Rash and Ulcer. HEENT Not Present- Earache, Hearing Loss, Hoarseness, Nose Bleed, Oral Ulcers, Ringing in the Ears, Seasonal Allergies, Sinus Pain, Sore Throat, Visual Disturbances, Wears glasses/contact lenses and Yellow Eyes. Cardiovascular Not Present- Chest Pain, Difficulty Breathing Lying Down, Leg Cramps, Palpitations, Rapid Heart Rate, Shortness of Breath and Swelling of Extremities. Gastrointestinal Not Present- Abdominal Pain, Bloating, Bloody Stool, Change in Bowel Habits, Chronic diarrhea, Constipation, Difficulty Swallowing, Excessive gas, Gets full quickly at meals, Hemorrhoids, Indigestion, Nausea, Rectal Pain and Vomiting. Female Genitourinary Not Present- Frequency, Nocturia, Painful Urination, Pelvic Pain and Urgency. Musculoskeletal Present- Swelling of Extremities. Not Present- Back Pain, Joint Pain, Joint Stiffness, Muscle Pain and Muscle Weakness. Neurological Present- Numbness. Not Present- Decreased Memory, Fainting, Headaches, Seizures, Tingling, Tremor, Trouble walking and Weakness. Psychiatric Not Present- Anxiety, Bipolar, Change in Sleep Pattern, Depression, Fearful and Frequent crying. Hematology Not Present- Easy Bruising, Excessive bleeding, Gland problems, HIV and Persistent Infections.  Vitals Fay Records CMA; 06/02/2015 11:30 AM) 06/02/2015 11:29 AM Weight: 198.8 lb Height: 64in Body Surface Area: 1.95 m Body Mass Index: 34.12 kg/m  Temp.: 98.67F  Pulse: 74 (Regular)  BP: 126/84 (Sitting, Left Arm, Standard)       Physical Exam Ardeth Sportsman MD; 06/02/2015 12:06 PM) General Mental Status-Alert. General Appearance-Not in acute distress,  Not Sickly. Orientation-Oriented X3. Hydration-Well hydrated. Voice-Normal.  Integumentary Global Assessment Upon inspection and palpation of skin surfaces of the - Axillae: non-tender, no inflammation or ulceration, no drainage. and Distribution of scalp and body hair is normal. General Characteristics Temperature - normal  warmth is noted.  Head and Neck Head-normocephalic, atraumatic with no lesions or palpable masses. Face Global Assessment - atraumatic, no absence of expression. Neck Global Assessment - no abnormal movements, no bruit auscultated on the right, no bruit auscultated on the left, no decreased range of motion, non-tender. Trachea-midline. Thyroid Gland Characteristics - non-tender.  Eye Eyeball - Left-Extraocular movements intact, No Nystagmus. Eyeball - Right-Extraocular movements intact, No Nystagmus. Cornea - Left-No Hazy. Cornea - Right-No Hazy. Sclera/Conjunctiva - Left-No scleral icterus, No Discharge. Sclera/Conjunctiva - Right-No scleral icterus, No Discharge. Pupil - Left-Direct reaction to light normal. Pupil - Right-Direct reaction to light normal.  ENMT Ears Pinna - Left - no drainage observed, no generalized tenderness observed. Right - no drainage observed, no generalized tenderness observed. Nose and Sinuses External Inspection of the Nose - no destructive lesion observed. Inspection of the nares - Left - quiet respiration. Right - quiet respiration. Mouth and Throat Lips - Upper Lip - no fissures observed, no pallor noted. Lower Lip - no fissures observed, no pallor noted. Nasopharynx - no discharge present. Oral Cavity/Oropharynx - Tongue - no dryness observed. Oral Mucosa - no cyanosis observed. Hypopharynx - no evidence of airway distress observed.  Chest and Lung Exam Inspection Movements - Normal and Symmetrical. Accessory muscles - No use of accessory muscles in breathing. Palpation Palpation of the chest  reveals - Non-tender. Auscultation Breath sounds - Normal and Clear.  Cardiovascular Auscultation Rhythm - Regular. Murmurs & Other Heart Sounds - Auscultation of the heart reveals - No Murmurs and No Systolic Clicks.  Abdomen Inspection Inspection of the abdomen reveals - No Visible peristalsis and No Abnormal pulsations. Umbilicus - No Bleeding, No Urine drainage. Palpation/Percussion Palpation and Percussion of the abdomen reveal - Soft, Non Tender, No Rebound tenderness, No Rigidity (guarding) and No Cutaneous hyperesthesia. Note: Obese but soft. Midline umbilical and infraumbilical region without hernia recurrence. Left paramedian bulging. Left lower quadrant and upper aspect of panniculus. 6 x 5 cm region. Sensitive but reducible. Consistent with hernia.   Female Genitourinary Sexual Maturity Tanner 5 - Adult hair pattern. Note: No inguinal hernias. No vaginal bleeding nor discharge   Peripheral Vascular Upper Extremity Inspection - Left - No Cyanotic nailbeds, Not Ischemic. Right - No Cyanotic nailbeds, Not Ischemic.  Neurologic Neurologic evaluation reveals -normal attention span and ability to concentrate, able to name objects and repeat phrases. Appropriate fund of knowledge , normal sensation and normal coordination. Mental Status Affect - not angry, not paranoid. Cranial Nerves-Normal Bilaterally. Gait-Normal.  Neuropsychiatric Mental status exam performed with findings of-able to articulate well with normal speech/language, rate, volume and coherence, thought content normal with ability to perform basic computations and apply abstract reasoning and no evidence of hallucinations, delusions, obsessions or homicidal/suicidal ideation.  Musculoskeletal Global Assessment Spine, Ribs and Pelvis - no instability, subluxation or laxity. Right Upper Extremity - no instability, subluxation or laxity.  Lymphatic Head & Neck  General Head & Neck Lymphatics:  Bilateral - Description - No Localized lymphadenopathy. Axillary  General Axillary Region: Bilateral - Description - No Localized lymphadenopathy. Femoral & Inguinal  Generalized Femoral & Inguinal Lymphatics: Left - Description - No Localized lymphadenopathy. Right - Description - No Localized lymphadenopathy.    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 Rather lateral. I think she would benefit from repair. I would do laparoscopic underlay reproach. Would use polypropylene since Parietex last time. Large sheet to avoid recurrence. Too large  for primary repair and that will fail. Mesh will lower the chance of recurrence.  Increased risk of prolonged lysis of adhesions or other complications. However natural history risk worse. She's tried intentionally to lose weight but pain limits her activity level. She does not smoke. She is not diabetic. Hopefully we can get her through this safely. She is interested in proceeding.  She would like until after school is over & after Ramadan. Therefore in July. I will be gone part of that month but we will ultimately trying to find a time that works for her and me.  ENCOUNTER FOR PRE-OPERATIVE EXAMINATION - [facility] (W09.811) Current Plans You are being scheduled for surgery - Our schedulers will call you.  You should hear from our office's scheduling department within 5 working days about the location, date, and time of surgery. We try to make accommodations for patient's preferences in scheduling surgery, but sometimes the OR schedule or the surgeon's schedule prevents [place] from making those accommodations.  If you have not heard from our office [phone] in 5 working days, call the office and ask for your surgeon's nurse.  If you have other questions about your diagnosis, plan, or surgery, call the office and ask for your surgeon's  nurse.  Written instructions provided The anatomy & physiology of the abdominal wall was discussed. The pathophysiology of hernias was discussed. Natural history risks without surgery including progeressive enlargement, pain, incarceration, & strangulation was discussed. Contributors to complications such as smoking, obesity, diabetes, prior surgery, etc were discussed.  I feel the risks of no intervention will lead to serious problems that outweigh the operative risks; therefore, I recommended surgery to reduce and repair the hernia. I explained laparoscopic techniques with possible need for an open approach. I noted the probable use of mesh to patch and/or buttress the hernia repair  Risks such as bleeding, infection, abscess, need for further treatment, heart attack, death, and other risks were discussed. I noted a good likelihood this will help address the problem. Goals of post-operative recovery were discussed as well. Possibility that this will not correct all symptoms was explained. I stressed the importance of low-impact activity, aggressive pain control, avoiding constipation, & not pushing through pain to minimize risk of post-operative chronic pain or injury. Possibility of reherniation especially with smoking, obesity, diabetes, immunosuppression, and other health conditions was discussed. We will work to minimize complications.  An educational handout further explaining the pathology & treatment options was given as well. Questions were answered. The patient expresses understanding & wishes to proceed with surgery.  Pt Education - Pamphlet Given - Laparoscopic Hernia Repair: discussed with patient and provided information. Pt Education - [facility] Hernia Post-Op HCI (Diana Armijo): discussed with patient and provided information. Pt Education - [facility] Pain Control (Travin Marik)  Ardeth Sportsman, M.D., F.A.C.S. Gastrointestinal and Minimally Invasive Surgery [facility] [address]  [address] [place], [place] [address] [phone] Main / Paging

## 2015-09-07 ENCOUNTER — Encounter (HOSPITAL_COMMUNITY)
Admission: RE | Admit: 2015-09-07 | Discharge: 2015-09-07 | Disposition: A | Discharge status: Home / Self Care | Payer: BC Managed Care – PPO | Source: Ambulatory Visit | Attending: Surgery | Admitting: Surgery

## 2015-09-07 ENCOUNTER — Encounter (HOSPITAL_COMMUNITY): Payer: Self-pay

## 2015-09-07 ENCOUNTER — Encounter (INDEPENDENT_AMBULATORY_CARE_PROVIDER_SITE_OTHER): Payer: Self-pay

## 2015-09-07 DIAGNOSIS — K660 Peritoneal adhesions (postprocedural) (postinfection): Secondary | ICD-10-CM | POA: Diagnosis not present

## 2015-09-07 DIAGNOSIS — K432 Incisional hernia without obstruction or gangrene: Secondary | ICD-10-CM | POA: Diagnosis not present

## 2015-09-07 DIAGNOSIS — D649 Anemia, unspecified: Secondary | ICD-10-CM | POA: Diagnosis not present

## 2015-09-07 DIAGNOSIS — K439 Ventral hernia without obstruction or gangrene: Secondary | ICD-10-CM | POA: Diagnosis present

## 2015-09-07 HISTORY — DX: Other specified postprocedural states: Z98.890

## 2015-09-07 HISTORY — DX: Nausea with vomiting, unspecified: R11.2

## 2015-09-07 LAB — CBC
HCT: 28.7 % — ABNORMAL LOW (ref 36.0–46.0)
HEMOGLOBIN: 8.5 g/dL — AB (ref 12.0–15.0)
MCH: 20.5 pg — ABNORMAL LOW (ref 26.0–34.0)
MCHC: 29.6 g/dL — AB (ref 30.0–36.0)
MCV: 69.2 fL — ABNORMAL LOW (ref 78.0–100.0)
Platelets: 296 10*3/uL (ref 150–400)
RBC: 4.15 MIL/uL (ref 3.87–5.11)
RDW: 19.9 % — ABNORMAL HIGH (ref 11.5–15.5)
WBC: 4.6 10*3/uL (ref 4.0–10.5)

## 2015-09-07 LAB — HCG, SERUM, QUALITATIVE: PREG SERUM: NEGATIVE

## 2015-09-08 MED ORDER — DEXTROSE 5 % IV SOLN
340.0000 mg | INTRAVENOUS | Status: AC
  Administered 2015-09-09: 340 mg via INTRAVENOUS
  Filled 2015-09-08 (×2): qty 8.5

## 2015-09-09 ENCOUNTER — Ambulatory Visit (HOSPITAL_COMMUNITY): Payer: BC Managed Care – PPO | Admitting: Certified Registered Nurse Anesthetist

## 2015-09-09 ENCOUNTER — Observation Stay (HOSPITAL_COMMUNITY)
Admission: RE | Admit: 2015-09-09 | Discharge: 2015-09-10 | Disposition: A | Discharge status: Home / Self Care | Payer: BC Managed Care – PPO | Source: Ambulatory Visit | Attending: Surgery | Admitting: Surgery

## 2015-09-09 ENCOUNTER — Encounter (HOSPITAL_COMMUNITY)
Admission: RE | Disposition: A | Discharge status: Home / Self Care | Payer: Self-pay | Source: Ambulatory Visit | Attending: Surgery

## 2015-09-09 ENCOUNTER — Encounter (HOSPITAL_COMMUNITY): Payer: Self-pay | Admitting: *Deleted

## 2015-09-09 DIAGNOSIS — K660 Peritoneal adhesions (postprocedural) (postinfection): Secondary | ICD-10-CM | POA: Insufficient documentation

## 2015-09-09 DIAGNOSIS — K432 Incisional hernia without obstruction or gangrene: Principal | ICD-10-CM | POA: Diagnosis present

## 2015-09-09 DIAGNOSIS — D649 Anemia, unspecified: Secondary | ICD-10-CM | POA: Insufficient documentation

## 2015-09-09 HISTORY — PX: VENTRAL HERNIA REPAIR: SHX424

## 2015-09-09 HISTORY — PX: LAPAROSCOPIC LYSIS OF ADHESIONS: SHX5905

## 2015-09-09 HISTORY — PX: INSERTION OF MESH: SHX5868

## 2015-09-09 SURGERY — LYSIS, ADHESIONS, LAPAROSCOPIC
Anesthesia: General

## 2015-09-09 MED ORDER — SUCCINYLCHOLINE CHLORIDE 20 MG/ML IJ SOLN
INTRAMUSCULAR | Status: DC | PRN
  Administered 2015-09-09: 100 mg via INTRAVENOUS

## 2015-09-09 MED ORDER — DIPHENHYDRAMINE HCL 12.5 MG/5ML PO ELIX: 12.5000 mg | ORAL_SOLUTION | Freq: Four times a day (QID) | ORAL | Status: DC | PRN

## 2015-09-09 MED ORDER — PROCHLORPERAZINE EDISYLATE 5 MG/ML IJ SOLN: 5.0000 mg | INTRAMUSCULAR | Status: DC | PRN

## 2015-09-09 MED ORDER — SODIUM CHLORIDE 0.9 % IJ SOLN
INTRAMUSCULAR | Status: AC
  Filled 2015-09-09: qty 10

## 2015-09-09 MED ORDER — FENTANYL CITRATE (PF) 100 MCG/2ML IJ SOLN
INTRAMUSCULAR | Status: DC | PRN
  Administered 2015-09-09 (×7): 50 ug via INTRAVENOUS

## 2015-09-09 MED ORDER — METOPROLOL TARTRATE 12.5 MG HALF TABLET
12.5000 mg | ORAL_TABLET | Freq: Two times a day (BID) | ORAL | Status: DC | PRN
  Filled 2015-09-09: qty 1

## 2015-09-09 MED ORDER — CELECOXIB 200 MG PO CAPS
400.0000 mg | ORAL_CAPSULE | ORAL | Status: AC
  Administered 2015-09-09: 400 mg via ORAL
  Filled 2015-09-09: qty 2

## 2015-09-09 MED ORDER — FENTANYL CITRATE (PF) 250 MCG/5ML IJ SOLN
INTRAMUSCULAR | Status: AC
  Filled 2015-09-09: qty 5

## 2015-09-09 MED ORDER — DEXAMETHASONE SODIUM PHOSPHATE 10 MG/ML IJ SOLN
INTRAMUSCULAR | Status: DC | PRN
  Administered 2015-09-09: 10 mg via INTRAVENOUS

## 2015-09-09 MED ORDER — ONDANSETRON HCL 4 MG/2ML IJ SOLN
INTRAMUSCULAR | Status: AC
  Filled 2015-09-09: qty 2

## 2015-09-09 MED ORDER — ONDANSETRON HCL 4 MG/2ML IJ SOLN
INTRAMUSCULAR | Status: DC | PRN
Start: 2015-09-09 — End: 2015-09-09
  Administered 2015-09-09: 4 mg via INTRAVENOUS

## 2015-09-09 MED ORDER — METHOCARBAMOL 1000 MG/10ML IJ SOLN
1000.0000 mg | Freq: Four times a day (QID) | INTRAVENOUS | Status: DC | PRN
  Administered 2015-09-09: 1000 mg via INTRAVENOUS
  Filled 2015-09-09 (×3): qty 10

## 2015-09-09 MED ORDER — BUPIVACAINE-EPINEPHRINE (PF) 0.25% -1:200000 IJ SOLN
INTRAMUSCULAR | Status: AC
  Filled 2015-09-09: qty 30

## 2015-09-09 MED ORDER — ALUM & MAG HYDROXIDE-SIMETH 200-200-20 MG/5ML PO SUSP: 30.0000 mL | Freq: Four times a day (QID) | ORAL | Status: DC | PRN

## 2015-09-09 MED ORDER — OXYCODONE HCL 5 MG PO TABS
5.0000 mg | ORAL_TABLET | ORAL | Status: DC | PRN
  Administered 2015-09-10: 10 mg via ORAL
  Administered 2015-09-10 (×2): 5 mg via ORAL
  Filled 2015-09-09 (×2): qty 1
  Filled 2015-09-09: qty 2

## 2015-09-09 MED ORDER — CHLORHEXIDINE GLUCONATE CLOTH 2 % EX PADS: 6.0000 | MEDICATED_PAD | Freq: Once | CUTANEOUS | Status: DC

## 2015-09-09 MED ORDER — ADULT MULTIVITAMIN W/MINERALS CH
1.0000 | ORAL_TABLET | Freq: Every day | ORAL | Status: DC | PRN
  Filled 2015-09-09: qty 1

## 2015-09-09 MED ORDER — METHOCARBAMOL 750 MG PO TABS: 750.0000 mg | ORAL_TABLET | Freq: Four times a day (QID) | ORAL | 2 refills | Status: DC | PRN

## 2015-09-09 MED ORDER — HYDROMORPHONE HCL 1 MG/ML IJ SOLN
0.2500 mg | INTRAMUSCULAR | Status: DC | PRN
  Administered 2015-09-09 (×2): 0.5 mg via INTRAVENOUS

## 2015-09-09 MED ORDER — PROPOFOL 10 MG/ML IV BOLUS
INTRAVENOUS | Status: DC | PRN
  Administered 2015-09-09: 150 mg via INTRAVENOUS

## 2015-09-09 MED ORDER — HYDRALAZINE HCL 20 MG/ML IJ SOLN: 10.0000 mg | INTRAMUSCULAR | Status: DC | PRN

## 2015-09-09 MED ORDER — SUGAMMADEX SODIUM 200 MG/2ML IV SOLN
INTRAVENOUS | Status: AC
  Filled 2015-09-09: qty 2

## 2015-09-09 MED ORDER — BUPIVACAINE LIPOSOME 1.3 % IJ SUSP
INTRAMUSCULAR | Status: DC | PRN
  Administered 2015-09-09: 20 mL

## 2015-09-09 MED ORDER — DIPHENHYDRAMINE HCL 50 MG/ML IJ SOLN: 12.5000 mg | Freq: Four times a day (QID) | INTRAMUSCULAR | Status: DC | PRN

## 2015-09-09 MED ORDER — EPHEDRINE SULFATE 50 MG/ML IJ SOLN
INTRAMUSCULAR | Status: AC
  Filled 2015-09-09: qty 1

## 2015-09-09 MED ORDER — LACTATED RINGERS IV SOLN
INTRAVENOUS | Status: DC
  Administered 2015-09-09: 50 mL/h via INTRAVENOUS

## 2015-09-09 MED ORDER — LACTATED RINGERS IV BOLUS (SEPSIS): 1000.0000 mL | Freq: Three times a day (TID) | INTRAVENOUS | Status: DC | PRN

## 2015-09-09 MED ORDER — BISACODYL 10 MG RE SUPP
10.0000 mg | Freq: Two times a day (BID) | RECTAL | Status: DC | PRN
Start: 2015-09-09 — End: 2015-09-10

## 2015-09-09 MED ORDER — BUPIVACAINE LIPOSOME 1.3 % IJ SUSP
20.0000 mL | INTRAMUSCULAR | Status: DC
  Filled 2015-09-09: qty 20

## 2015-09-09 MED ORDER — PHENOL 1.4 % MT LIQD: 2.0000 | OROMUCOSAL | Status: DC | PRN

## 2015-09-09 MED ORDER — CLINDAMYCIN PHOSPHATE 900 MG/50ML IV SOLN
900.0000 mg | INTRAVENOUS | Status: AC
  Administered 2015-09-09: 900 mg via INTRAVENOUS

## 2015-09-09 MED ORDER — KETOROLAC TROMETHAMINE 30 MG/ML IJ SOLN
INTRAMUSCULAR | Status: AC
  Filled 2015-09-09: qty 1

## 2015-09-09 MED ORDER — ONDANSETRON HCL 4 MG/2ML IJ SOLN: 4.0000 mg | Freq: Four times a day (QID) | INTRAMUSCULAR | Status: DC | PRN

## 2015-09-09 MED ORDER — MENTHOL 3 MG MT LOZG: 1.0000 | LOZENGE | OROMUCOSAL | Status: DC | PRN

## 2015-09-09 MED ORDER — BUPIVACAINE-EPINEPHRINE 0.25% -1:200000 IJ SOLN
INTRAMUSCULAR | Status: DC | PRN
  Administered 2015-09-09: 80 mL

## 2015-09-09 MED ORDER — METOPROLOL TARTRATE 5 MG/5ML IV SOLN: 5.0000 mg | Freq: Four times a day (QID) | INTRAVENOUS | Status: DC | PRN

## 2015-09-09 MED ORDER — ACETAMINOPHEN 500 MG PO TABS
1000.0000 mg | ORAL_TABLET | ORAL | Status: AC
  Administered 2015-09-09: 1000 mg via ORAL
  Filled 2015-09-09: qty 2

## 2015-09-09 MED ORDER — SODIUM CHLORIDE 0.9 % IJ SOLN
INTRAMUSCULAR | Status: AC
  Filled 2015-09-09: qty 50

## 2015-09-09 MED ORDER — CLINDAMYCIN PHOSPHATE 900 MG/50ML IV SOLN
INTRAVENOUS | Status: AC
  Filled 2015-09-09: qty 50

## 2015-09-09 MED ORDER — ROCURONIUM BROMIDE 100 MG/10ML IV SOLN
INTRAVENOUS | Status: DC | PRN
  Administered 2015-09-09: 50 mg via INTRAVENOUS
  Administered 2015-09-09 (×2): 10 mg via INTRAVENOUS

## 2015-09-09 MED ORDER — LIDOCAINE HCL (CARDIAC) 20 MG/ML IV SOLN
INTRAVENOUS | Status: DC | PRN
  Administered 2015-09-09: 100 mg via INTRAVENOUS

## 2015-09-09 MED ORDER — MIDAZOLAM HCL 5 MG/5ML IJ SOLN
INTRAMUSCULAR | Status: DC | PRN
  Administered 2015-09-09 (×2): 1 mg via INTRAVENOUS

## 2015-09-09 MED ORDER — EPHEDRINE SULFATE 50 MG/ML IJ SOLN
INTRAMUSCULAR | Status: DC | PRN
  Administered 2015-09-09: 10 mg via INTRAVENOUS

## 2015-09-09 MED ORDER — BUPIVACAINE-EPINEPHRINE 0.25% -1:200000 IJ SOLN
INTRAMUSCULAR | Status: AC
  Filled 2015-09-09: qty 1

## 2015-09-09 MED ORDER — ROCURONIUM BROMIDE 100 MG/10ML IV SOLN
INTRAVENOUS | Status: AC
  Filled 2015-09-09: qty 1

## 2015-09-09 MED ORDER — LACTATED RINGERS IV SOLN
INTRAVENOUS | Status: DC | PRN
  Administered 2015-09-09 (×3): via INTRAVENOUS

## 2015-09-09 MED ORDER — ONDANSETRON 4 MG PO TBDP: 4.0000 mg | ORAL_TABLET | Freq: Four times a day (QID) | ORAL | Status: DC | PRN

## 2015-09-09 MED ORDER — HYDROMORPHONE HCL 1 MG/ML IJ SOLN
0.5000 mg | INTRAMUSCULAR | Status: DC | PRN
  Administered 2015-09-09: 0.5 mg via INTRAVENOUS
  Administered 2015-09-10: 1 mg via INTRAVENOUS
  Filled 2015-09-09 (×2): qty 1

## 2015-09-09 MED ORDER — HYDROMORPHONE HCL 1 MG/ML IJ SOLN
INTRAMUSCULAR | Status: AC
  Filled 2015-09-09: qty 1

## 2015-09-09 MED ORDER — SUGAMMADEX SODIUM 200 MG/2ML IV SOLN
INTRAVENOUS | Status: DC | PRN
  Administered 2015-09-09: 200 mg via INTRAVENOUS

## 2015-09-09 MED ORDER — ACETAMINOPHEN 325 MG PO TABS: 325.0000 mg | ORAL_TABLET | Freq: Four times a day (QID) | ORAL | Status: DC | PRN

## 2015-09-09 MED ORDER — LIDOCAINE HCL (CARDIAC) 20 MG/ML IV SOLN
INTRAVENOUS | Status: AC
  Filled 2015-09-09: qty 5

## 2015-09-09 MED ORDER — OXYCODONE HCL 5 MG PO TABS: 5.0000 mg | ORAL_TABLET | ORAL | 0 refills | Status: DC | PRN

## 2015-09-09 MED ORDER — PROPOFOL 10 MG/ML IV BOLUS
INTRAVENOUS | Status: AC
Start: 2015-09-09 — End: 2015-09-09
  Filled 2015-09-09: qty 20

## 2015-09-09 MED ORDER — ACETAMINOPHEN 650 MG RE SUPP: 650.0000 mg | Freq: Four times a day (QID) | RECTAL | Status: DC | PRN

## 2015-09-09 MED ORDER — FENTANYL CITRATE (PF) 100 MCG/2ML IJ SOLN
INTRAMUSCULAR | Status: AC
  Filled 2015-09-09: qty 2

## 2015-09-09 MED ORDER — LACTATED RINGERS IV SOLN: INTRAVENOUS | Status: DC

## 2015-09-09 MED ORDER — DEXAMETHASONE SODIUM PHOSPHATE 10 MG/ML IJ SOLN
INTRAMUSCULAR | Status: AC
  Filled 2015-09-09: qty 1

## 2015-09-09 MED ORDER — MAGIC MOUTHWASH
15.0000 mL | Freq: Four times a day (QID) | ORAL | Status: DC | PRN
  Filled 2015-09-09: qty 15

## 2015-09-09 MED ORDER — MIDAZOLAM HCL 2 MG/2ML IJ SOLN
INTRAMUSCULAR | Status: AC
  Filled 2015-09-09: qty 2

## 2015-09-09 MED ORDER — NAPROXEN 500 MG PO TABS
500.0000 mg | ORAL_TABLET | Freq: Two times a day (BID) | ORAL | Status: DC
  Administered 2015-09-10: 500 mg via ORAL
  Filled 2015-09-09 (×2): qty 1

## 2015-09-09 MED ORDER — ENOXAPARIN SODIUM 40 MG/0.4ML ~~LOC~~ SOLN
40.0000 mg | SUBCUTANEOUS | Status: DC
  Administered 2015-09-10: 40 mg via SUBCUTANEOUS
  Filled 2015-09-09: qty 0.4

## 2015-09-09 MED ORDER — GABAPENTIN 300 MG PO CAPS
300.0000 mg | ORAL_CAPSULE | ORAL | Status: AC
Start: 2015-09-10 — End: 2015-09-09
  Administered 2015-09-09: 300 mg via ORAL
  Filled 2015-09-09: qty 1

## 2015-09-09 MED ORDER — METHOCARBAMOL 500 MG PO TABS: 500.0000 mg | ORAL_TABLET | Freq: Four times a day (QID) | ORAL | Status: DC | PRN

## 2015-09-09 MED ORDER — LIP MEDEX EX OINT
1.0000 "application " | TOPICAL_OINTMENT | Freq: Two times a day (BID) | CUTANEOUS | Status: DC
  Administered 2015-09-09 – 2015-09-10 (×2): 1 via TOPICAL
  Filled 2015-09-09 (×2): qty 7

## 2015-09-09 SURGICAL SUPPLY — 42 items
APPLIER CLIP 5 13 M/L LIGAMAX5 (MISCELLANEOUS)
APR CLP MED LRG 5 ANG JAW (MISCELLANEOUS)
BINDER ABDOMINAL 12 ML 46-62 (SOFTGOODS) ×1 IMPLANT
CABLE HIGH FREQUENCY MONO STRZ (ELECTRODE) ×2 IMPLANT
CHLORAPREP W/TINT 26ML (MISCELLANEOUS) ×2 IMPLANT
CLIP APPLIE 5 13 M/L LIGAMAX5 (MISCELLANEOUS) IMPLANT
COVER SURGICAL LIGHT HANDLE (MISCELLANEOUS) ×2 IMPLANT
DECANTER SPIKE VIAL GLASS SM (MISCELLANEOUS) ×2 IMPLANT
DEVICE SECURE STRAP 25 ABSORB (INSTRUMENTS) ×1 IMPLANT
DEVICE TROCAR PUNCTURE CLOSURE (ENDOMECHANICALS) ×2 IMPLANT
DRAPE LAPAROSCOPIC ABDOMINAL (DRAPES) ×1 IMPLANT
DRAPE WARM FLUID 44X44 (DRAPE) ×2 IMPLANT
DRSG TEGADERM 2-3/8X2-3/4 SM (GAUZE/BANDAGES/DRESSINGS) ×6 IMPLANT
DRSG TEGADERM 4X4.75 (GAUZE/BANDAGES/DRESSINGS) IMPLANT
ELECT REM PT RETURN 9FT ADLT (ELECTROSURGICAL) ×2
ELECTRODE REM PT RTRN 9FT ADLT (ELECTROSURGICAL) ×1 IMPLANT
GAUZE SPONGE 2X2 8PLY STRL LF (GAUZE/BANDAGES/DRESSINGS) IMPLANT
GLOVE ECLIPSE 8.0 STRL XLNG CF (GLOVE) ×10 IMPLANT
GLOVE INDICATOR 8.0 STRL GRN (GLOVE) ×2 IMPLANT
GOWN STRL REUS W/TWL XL LVL3 (GOWN DISPOSABLE) ×6 IMPLANT
IRRIG SUCT STRYKERFLOW 2 WTIP (MISCELLANEOUS) ×2
IRRIGATION SUCT STRKRFLW 2 WTP (MISCELLANEOUS) IMPLANT
KIT BASIN OR (CUSTOM PROCEDURE TRAY) ×2 IMPLANT
MARKER SKIN DUAL TIP RULER LAB (MISCELLANEOUS) ×2 IMPLANT
MESH VENTRALIGHT ST 8X10 (Mesh General) ×1 IMPLANT
NDL SPNL 22GX3.5 QUINCKE BK (NEEDLE) IMPLANT
NEEDLE SPNL 22GX3.5 QUINCKE BK (NEEDLE) IMPLANT
PAD POSITIONING PINK XL (MISCELLANEOUS) ×2 IMPLANT
SCISSORS LAP 5X35 DISP (ENDOMECHANICALS) ×2 IMPLANT
SHEARS HARMONIC ACE PLUS 36CM (ENDOMECHANICALS) IMPLANT
SLEEVE XCEL OPT CAN 5 100 (ENDOMECHANICALS) ×4 IMPLANT
SPONGE GAUZE 2X2 STER 10/PKG (GAUZE/BANDAGES/DRESSINGS)
STRIP CLOSURE SKIN 1/2X4 (GAUZE/BANDAGES/DRESSINGS) ×4 IMPLANT
SUT MNCRL AB 4-0 PS2 18 (SUTURE) ×2 IMPLANT
SUT PDS AB 1 CT1 27 (SUTURE) ×5 IMPLANT
SUT PROLENE 1 CT 1 30 (SUTURE) ×15 IMPLANT
SUT VIC AB 2-0 SH 18 (SUTURE) ×1 IMPLANT
TOWEL OR 17X26 10 PK STRL BLUE (TOWEL DISPOSABLE) ×2 IMPLANT
TRAY LAPAROSCOPIC (CUSTOM PROCEDURE TRAY) ×2 IMPLANT
TROCAR BLADELESS OPT 5 100 (ENDOMECHANICALS) ×2 IMPLANT
TROCAR XCEL NON-BLD 11X100MML (ENDOMECHANICALS) IMPLANT
TUBING INSUF HEATED (TUBING) ×2 IMPLANT

## 2015-09-09 NOTE — H&P (View-Only) (Signed)
Marcia Carter 06/02/2015 11:29 AM [facility] Patient #: [id number] DOB: 03-21-1965 Married / Language: English / Race: Black or African American Female    History of Present Illness  The patient is a 50 year old female who presents with an incisional hernia. Note for "Incisional hernia": Patient comes for surgical consultation at the request of her primary care physician, Boneta Lucks NP, [facility]. Concern for recurrent abdominal hernia  Very pleasant woman. Both our children go to the same elementary school. 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 Last C/S in 2000. Primarily repaired. Recurrent. Open repair with mesh 2003 with hysterectomy. Recurred. Laparoscopic underlay repair with Parietex mesh 2007. Did rather well. No episodes of obstruction. No nausea or vomiting. Mild constipation usually controlled with high fiber diet. Portion patient had episode of severe coughing with a cold. Congestion and bronchitis. She does not smoke. Eventually improved. However she noted some bulging in now discomfort on the left side of her abdomen. Not in the midline as before. However symptoms and bulging suspicious for another hernia. She was concerned. Primary care physician concerning for recurrent hernia. Therefore surgical consultation requested.   Past Surgical History Ardeth Sportsman, MD; 06/02/2015 11:40 AM) Ventral Hernia Repair11/21/2007 Dr Marcy Panning. Parietex 30x20cm Ventral / Umbilical Hernia Surgery05/26/2003 open VWH repair w mesh. Dr Dewaine Conger / Inguinal Hernia Surgery - Multiple times  Allergies Fay Records, CMA; 06/02/2015 11:29 AM) No Known Drug Allergies04/25/2017  Medication History Fay Records, CMA; 06/02/2015 11:29 AM) No Current Medications Medications Reconciled  Travel History Ardeth Sportsman, MD; 06/02/2015 12:08 PM) originally from [place].  Has lived in [place] several decades.    Review of Systems Fay Records CMA; 06/02/2015 11:29 AM) General Not Present- Appetite Loss, Chills, Fatigue, Fever, Night Sweats, Weight Gain and Weight Loss. Skin Not Present- Change in Wart/Mole, Dryness, Hives, Jaundice, New Lesions, Non-Healing Wounds, Rash and Ulcer. HEENT Not Present- Earache, Hearing Loss, Hoarseness, Nose Bleed, Oral Ulcers, Ringing in the Ears, Seasonal Allergies, Sinus Pain, Sore Throat, Visual Disturbances, Wears glasses/contact lenses and Yellow Eyes. Cardiovascular Not Present- Chest Pain, Difficulty Breathing Lying Down, Leg Cramps, Palpitations, Rapid Heart Rate, Shortness of Breath and Swelling of Extremities. Gastrointestinal Not Present- Abdominal Pain, Bloating, Bloody Stool, Change in Bowel Habits, Chronic diarrhea, Constipation, Difficulty Swallowing, Excessive gas, Gets full quickly at meals, Hemorrhoids, Indigestion, Nausea, Rectal Pain and Vomiting. Female Genitourinary Not Present- Frequency, Nocturia, Painful Urination, Pelvic Pain and Urgency. Musculoskeletal Present- Swelling of Extremities. Not Present- Back Pain, Joint Pain, Joint Stiffness, Muscle Pain and Muscle Weakness. Neurological Present- Numbness. Not Present- Decreased Memory, Fainting, Headaches, Seizures, Tingling, Tremor, Trouble walking and Weakness. Psychiatric Not Present- Anxiety, Bipolar, Change in Sleep Pattern, Depression, Fearful and Frequent crying. Hematology Not Present- Easy Bruising, Excessive bleeding, Gland problems, HIV and Persistent Infections.  Vitals Fay Records CMA; 06/02/2015 11:30 AM) 06/02/2015 11:29 AM Weight: 198.8 lb Height: 64in Body Surface Area: 1.95 m Body Mass Index: 34.12 kg/m  Temp.: 98.67F  Pulse: 74 (Regular)  BP: 126/84 (Sitting, Left Arm, Standard)       Physical Exam Ardeth Sportsman MD; 06/02/2015 12:06 PM) General Mental Status-Alert. General Appearance-Not in acute distress,  Not Sickly. Orientation-Oriented X3. Hydration-Well hydrated. Voice-Normal.  Integumentary Global Assessment Upon inspection and palpation of skin surfaces of the - Axillae: non-tender, no inflammation or ulceration, no drainage. and Distribution of scalp and body hair is normal. General Characteristics Temperature - normal  warmth is noted.  Head and Neck Head-normocephalic, atraumatic with no lesions or palpable masses. Face Global Assessment - atraumatic, no absence of expression. Neck Global Assessment - no abnormal movements, no bruit auscultated on the right, no bruit auscultated on the left, no decreased range of motion, non-tender. Trachea-midline. Thyroid Gland Characteristics - non-tender.  Eye Eyeball - Left-Extraocular movements intact, No Nystagmus. Eyeball - Right-Extraocular movements intact, No Nystagmus. Cornea - Left-No Hazy. Cornea - Right-No Hazy. Sclera/Conjunctiva - Left-No scleral icterus, No Discharge. Sclera/Conjunctiva - Right-No scleral icterus, No Discharge. Pupil - Left-Direct reaction to light normal. Pupil - Right-Direct reaction to light normal.  ENMT Ears Pinna - Left - no drainage observed, no generalized tenderness observed. Right - no drainage observed, no generalized tenderness observed. Nose and Sinuses External Inspection of the Nose - no destructive lesion observed. Inspection of the nares - Left - quiet respiration. Right - quiet respiration. Mouth and Throat Lips - Upper Lip - no fissures observed, no pallor noted. Lower Lip - no fissures observed, no pallor noted. Nasopharynx - no discharge present. Oral Cavity/Oropharynx - Tongue - no dryness observed. Oral Mucosa - no cyanosis observed. Hypopharynx - no evidence of airway distress observed.  Chest and Lung Exam Inspection Movements - Normal and Symmetrical. Accessory muscles - No use of accessory muscles in breathing. Palpation Palpation of the chest  reveals - Non-tender. Auscultation Breath sounds - Normal and Clear.  Cardiovascular Auscultation Rhythm - Regular. Murmurs & Other Heart Sounds - Auscultation of the heart reveals - No Murmurs and No Systolic Clicks.  Abdomen Inspection Inspection of the abdomen reveals - No Visible peristalsis and No Abnormal pulsations. Umbilicus - No Bleeding, No Urine drainage. Palpation/Percussion Palpation and Percussion of the abdomen reveal - Soft, Non Tender, No Rebound tenderness, No Rigidity (guarding) and No Cutaneous hyperesthesia. Note: Obese but soft. Midline umbilical and infraumbilical region without hernia recurrence. Left paramedian bulging. Left lower quadrant and upper aspect of panniculus. 6 x 5 cm region. Sensitive but reducible. Consistent with hernia.   Female Genitourinary Sexual Maturity Tanner 5 - Adult hair pattern. Note: No inguinal hernias. No vaginal bleeding nor discharge   Peripheral Vascular Upper Extremity Inspection - Left - No Cyanotic nailbeds, Not Ischemic. Right - No Cyanotic nailbeds, Not Ischemic.  Neurologic Neurologic evaluation reveals -normal attention span and ability to concentrate, able to name objects and repeat phrases. Appropriate fund of knowledge , normal sensation and normal coordination. Mental Status Affect - not angry, not paranoid. Cranial Nerves-Normal Bilaterally. Gait-Normal.  Neuropsychiatric Mental status exam performed with findings of-able to articulate well with normal speech/language, rate, volume and coherence, thought content normal with ability to perform basic computations and apply abstract reasoning and no evidence of hallucinations, delusions, obsessions or homicidal/suicidal ideation.  Musculoskeletal Global Assessment Spine, Ribs and Pelvis - no instability, subluxation or laxity. Right Upper Extremity - no instability, subluxation or laxity.  Lymphatic Head & Neck  General Head & Neck Lymphatics:  Bilateral - Description - No Localized lymphadenopathy. Axillary  General Axillary Region: Bilateral - Description - No Localized lymphadenopathy. Femoral & Inguinal  Generalized Femoral & Inguinal Lymphatics: Left - Description - No Localized lymphadenopathy. Right - Description - No Localized lymphadenopathy.    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 Rather lateral. I think she would benefit from repair. I would do laparoscopic underlay reproach. Would use polypropylene since Parietex last time. Large sheet to avoid recurrence. Too large  for primary repair and that will fail. Mesh will lower the chance of recurrence.  Increased risk of prolonged lysis of adhesions or other complications. However natural history risk worse. She's tried intentionally to lose weight but pain limits her activity level. She does not smoke. She is not diabetic. Hopefully we can get her through this safely. She is interested in proceeding.  She would like until after school is over & after Ramadan. Therefore in July. I will be gone part of that month but we will ultimately trying to find a time that works for her and me.  ENCOUNTER FOR PRE-OPERATIVE EXAMINATION - VWH (W09.811) Current Plans You are being scheduled for surgery - Our schedulers will call you.  You should hear from our office's scheduling department within 5 working days about the location, date, and time of surgery. We try to make accommodations for patient's preferences in scheduling surgery, but sometimes the OR schedule or the surgeon's schedule prevents [place] from making those accommodations.  If you have not heard from our office [phone] in 5 working days, call the office and ask for your surgeon's nurse.  If you have other questions about your diagnosis, plan, or surgery, call the office and ask for your surgeon's  nurse.  Written instructions provided The anatomy & physiology of the abdominal wall was discussed. The pathophysiology of hernias was discussed. Natural history risks without surgery including progeressive enlargement, pain, incarceration, & strangulation was discussed. Contributors to complications such as smoking, obesity, diabetes, prior surgery, etc were discussed.  I feel the risks of no intervention will lead to serious problems that outweigh the operative risks; therefore, I recommended surgery to reduce and repair the hernia. I explained laparoscopic techniques with possible need for an open approach. I noted the probable use of mesh to patch and/or buttress the hernia repair  Risks such as bleeding, infection, abscess, need for further treatment, heart attack, death, and other risks were discussed. I noted a good likelihood this will help address the problem. Goals of post-operative recovery were discussed as well. Possibility that this will not correct all symptoms was explained. I stressed the importance of low-impact activity, aggressive pain control, avoiding constipation, & not pushing through pain to minimize risk of post-operative chronic pain or injury. Possibility of reherniation especially with smoking, obesity, diabetes, immunosuppression, and other health conditions was discussed. We will work to minimize complications.  An educational handout further explaining the pathology & treatment options was given as well. Questions were answered. The patient expresses understanding & wishes to proceed with surgery.  Pt Education - Pamphlet Given - Laparoscopic Hernia Repair: discussed with patient and provided information. Pt Education - [facility] Hernia Post-Op HCI (Zyen Triggs): discussed with patient and provided information. Pt Education - [facility] Pain Control (Kenedy Haisley)  Ardeth Sportsman, M.D., F.A.C.S. Gastrointestinal and Minimally Invasive Surgery [facility] [address]  [address] [place], [place] [address] [phone] Main / Paging

## 2015-09-09 NOTE — Op Note (Addendum)
09/09/2015  4:27 PM  PATIENT:  Marcia Carter  50 y.o. female  Patient Care Team: Boneta Lucks, NP as PCP - General (Nurse Practitioner) Karie Soda, MD as Consulting Physician (General Surgery)  PRE-OPERATIVE DIAGNOSIS:  Recurrent incisional wall hernia  POST-OPERATIVE DIAGNOSIS:  Recurrent incisional wall hernia  PROCEDURE:   LAPAROSCOPIC LYSIS OF ADHESIONS LAPAROSCOPIC INCISIONAL HERNIA REPAIR INSERTION OF MESH  SURGEON:  Surgeon(s): Karie Soda, MD  ASSISTANT: RN   ANESTHESIA:     General Local anesthesia field block: (0.25% bupivacaine & liposomal  Bupivacaine [Experel])   EBL:  Total I/O In: 2000 [I.V.:2000] Out: 175 [Urine:150; Blood:25]  Delay start of Pharmacological VTE agent (>24hrs) due to surgical blood loss or risk of bleeding:  no  DRAINS: none   SPECIMEN:  No Specimen  DISPOSITION OF SPECIMEN:  N/A  COUNTS:  YES  PLAN OF CARE: Admit for overnight observation  PATIENT DISPOSITION:  PACU - hemodynamically stable.  INDICATION: Pleasant patient developed an incisional hernia after C-section.  Had initially a primary repair.  Had failure.  Had a laparoscopic underlay repair with mesh 2007 Parietex.  Felt a new hernia lateral to that repair.     Recommendation was made for surgical repair:  The anatomy & physiology of the abdominal wall was discussed. The pathophysiology of hernias was discussed. Natural history risks without surgery including progeressive enlargement, pain, incarceration & strangulation was discussed. Contributors to complications such as smoking, obesity, diabetes, prior surgery, etc were discussed.  I feel the risks of no intervention will lead to serious problems that outweigh the operative risks; therefore, I recommended surgery to reduce and repair the hernia. I explained laparoscopic techniques with possible need for an open approach. I noted the probable use of mesh to patch and/or buttress the hernia repair  Risks such as  bleeding, infection, abscess, need for further treatment, heart attack, death, and other risks were discussed. I noted a good likelihood this will help address the problem. Goals of post-operative recovery were discussed as well. Possibility that this will not correct all symptoms was explained. I stressed the importance of low-impact activity, aggressive pain control, avoiding constipation, & not pushing through pain to minimize risk of post-operative chronic pain or injury. Possibility of reherniation especially with smoking, obesity, diabetes, immunosuppression, and other health conditions was discussed. We will work to minimize complications.  An educational handout further explaining the pathology & treatment options was given as well. Questions were answered. The patient expresses understanding & wishes to proceed with surgery.   OR FINDINGS: 5.5x5cm hernia in the left mid abdomen.  Almost consistent with a Spegelian type hernia but near the larger old port site incision.  Very dense adhesions of greater omentum and small bowel to the prior Parietex midline mesh   Type of repair - Laparoscopic underlay repair   Name of mesh -  Bard Ventralight dual sided (polypropylene / Seprafilm)  Size of mesh - Height 20 cm, Width 25 cm  Orientation:  Transverse   Mesh overlap - 7-10 cm  Placement of mesh - Intraperitoneal underlay repair   DESCRIPTION:   Informed consent was confirmed. The patient underwent general anaesthesia without difficulty. The patient was positioned appropriately. VTE prevention in place. The patient's abdomen was clipped, prepped, & draped in a sterile fashion. Surgical timeout confirmed our plan.  The patient was positioned in reverse Trendelenburg. Abdominal entry was gained using optical entry technique in the left upper abdomen. Entry was clean. I induced carbon dioxide insufflation. Camera inspection  revealed no injury. Extra ports were carefully placed under direct  laparoscopic visualization.   Patient had dense adhesions to the most of the anterior abdominal wall.  Carefully did laparoscopic lysis of adhesions to expose the entire anterior abdominal wall.  I primarily used and focused cold scissors.   Eventually freed off greater omentum and colon supraumbilically and numerous loops of small bowel infraumbilically.  I made sure hemostasis was good.    I could identify the hernia in the left lateral mid abdomen/lower quadrant.  Looked almost like [place] began in type hernia but it was larger.  Then the left panniculus.  Within it but had not been incarcerated with small bowel or colon.  I mobilized the left colon off the left lateral abdomen for better exposure.  I mapped out the region using a needle passer.   To ensure that I would have at least 5 cm radial coverage outside of the hernia defect, I chose a 20x25cm cm dual sided mesh.  I placed #1 Prolene stitches around its edge about every 5 cm = 14 total.  I rolled the mesh & placed into the peritoneal cavity through the 10 cm fascial defect.  I unrolled  the mesh and positioned it appropriately.  I secured the mesh to cover up the hernia defect using a laparoscopic suture passer to pass the tails of the Prolene through the abdominal wall & tagged them with clamps.  I started out in four corners to make sure I had the mesh centered under the hernia defect appropriately, and then proceeded to work in quadrants.  The most lateral aspect is in the posterior axillary line along the left flank.  Inferior rim is suprapubic.  Medial side goes just across midline and overlaps with the old prior mesh.  We evacuated CO2 & desufflated the abdomen.  I tied the fascial stitches down.  I primarily closed the incisional hernia using #1 PDS interrupted in a transverse fashion.    I reinsufflated the abdomen.  The mesh provided at least 5-10 cm circumferential coverage around the entire region of hernia defects.   I tacked the edges &  central part of the mesh to the peritoneum/posterior rectus fascia with SecureStrap absorbable tacks.  I also passed #1 Prolene through the center of the mesh 5.  Two laterally, one through the primary repair, two medially.  A good field block of local anesthesia was used at fascial stitch sites & fascial closure areas.  Hemostasis was excellent.  Mesh laid well. Capnoperitoneum was evacuated. Ports were removed.  I closed the primary hernia repair site with 3-0 Vicryl deep dermal sutures.  The skin was closed with Monocryl at the port sites and Steri-Strips on the fascial stitch puncture sites.  Patient is being extubated to go to the recovery room. I'm about to discuss operative findings with the patient's husband.   Ardeth Sportsman, M.D., F.A.C.S. Gastrointestinal and Minimally Invasive Surgery [facility] [address] [place], [place] [address] [phone] Main / Paging

## 2015-09-09 NOTE — Interval H&P Note (Signed)
History and Physical Interval Note:  09/09/2015 12:47 PM  Marcia Carter  has presented today for surgery, with the diagnosis of Recurrent ventral wall abdominal wall hernia  The various methods of treatment have been discussed with the patient and family. After consideration of risks, benefits and other options for treatment, the patient has consented to  Procedure(s): LAPAROSCOPIC LYSIS OF ADHESIONS (N/A) LAPAROSCOPIC VENTRAL WALL HERNIA REPAIR (N/A) INSERTION OF MESH (N/A) as a surgical intervention .  The patient's history has been reviewed, patient examined, no change in status, stable for surgery.  I have reviewed the patient's chart and labs.  Questions were answered to the patient's satisfaction.     Serah Nicoletti C.

## 2015-09-09 NOTE — Transfer of Care (Signed)
Immediate Anesthesia Transfer of Care Note  Patient: [name]  Procedure(s) Performed: Procedure(s): LAPAROSCOPIC LYSIS OF ADHESIONS (N/A) LAPAROSCOPIC INCISIONAL HERNIA REPAIR (N/A) INSERTION OF MESH (N/A)  Patient Location: PACU  Anesthesia Type:General  Level of Consciousness:  sedated, patient cooperative and responds to stimulation  Airway & Oxygen Therapy:Patient Spontanous Breathing and Patient connected to face mask oxgen  Post-op Assessment:  Report given to PACU RN and Post -op Vital signs reviewed and stable  Post vital signs:  Reviewed and stable  Last Vitals:  Vitals:   09/09/15 1215  BP: 140/81  Pulse: 89  Resp: 16  Temp: 36.8 C    Complications: No apparent anesthesia complications

## 2015-09-09 NOTE — Anesthesia Procedure Notes (Signed)
Procedure Name: Intubation Date/Time: 09/09/2015 1:20 PM Performed by: Orest Dikes Pre-anesthesia Checklist: Patient identified, Emergency Drugs available, Suction available and Patient being monitored Patient Re-evaluated:Patient Re-evaluated prior to inductionOxygen Delivery Method: Circle system utilized Preoxygenation: Pre-oxygenation with 100% oxygen Intubation Type: IV induction Ventilation: Mask ventilation without difficulty Laryngoscope Size: Mac and 4 Tube type: Oral Tube size: 7.5 mm Number of attempts: 1 Airway Equipment and Method: Stylet and Oral airway Placement Confirmation: ETT inserted through vocal cords under direct vision,  positive ETCO2 and breath sounds checked- equal and bilateral Secured at: 21 cm Tube secured with: Tape Dental Injury: Teeth and Oropharynx as per pre-operative assessment

## 2015-09-09 NOTE — Anesthesia Postprocedure Evaluation (Signed)
Anesthesia Post Note  Patient: Danni E Val  Procedure(s) Performed: Procedure(s) (LRB): LAPAROSCOPIC LYSIS OF ADHESIONS (N/A) LAPAROSCOPIC INCISIONAL HERNIA REPAIR (N/A) INSERTION OF MESH (N/A)  Patient location during evaluation: PACU Anesthesia Type: General Level of consciousness: awake and alert Pain management: pain level controlled Vital Signs Assessment: post-procedure vital signs reviewed and stable Respiratory status: spontaneous breathing, nonlabored ventilation, respiratory function stable and patient connected to nasal cannula oxygen Cardiovascular status: blood pressure returned to baseline and stable Postop Assessment: no signs of nausea or vomiting Anesthetic complications: no    Last Vitals:  Vitals:   09/09/15 1700 09/09/15 1715  BP: 139/75 (!) 141/74  Pulse: 96 (!) 103  Resp: 18 20  Temp:      Last Pain:  Vitals:   09/09/15 1715  TempSrc:   PainSc: 4                  Renly Roots L

## 2015-09-09 NOTE — Discharge Instructions (Signed)
HERNIA REPAIR: POST OP INSTRUCTIONS ° °###################################################################### ° °EAT °Gradually transition to a high fiber diet with a fiber supplement over the next few weeks after discharge.  Start with a pureed / full liquid diet (see below) ° °WALK °Walk an hour a day.  Control your pain to do that.   ° °CONTROL PAIN °Control pain so that you can walk, sleep, tolerate sneezing/coughing, go up/down stairs. ° °HAVE A BOWEL MOVEMENT DAILY °Keep your bowels regular to avoid problems.  OK to try a laxative to override constipation.  OK to use an antidairrheal to slow down diarrhea.  Call if not better after 2 tries ° °CALL IF YOU HAVE PROBLEMS/CONCERNS °Call if you are still struggling despite following these instructions. °Call if you have concerns not answered by these instructions ° °###################################################################### ° ° ° °1. DIET: Follow a light bland diet the first 24 hours after arrival home, such as soup, liquids, crackers, etc.  Be sure to include lots of fluids daily.  Avoid fast food or heavy meals as your are more likely to get nauseated.  Eat a low fat the next few days after surgery. °2. Take your usually prescribed home medications unless otherwise directed. °3. PAIN CONTROL: °a. Pain is best controlled by a usual combination of three different methods TOGETHER: °i. Ice/Heat °ii. Over the counter pain medication °iii. Prescription pain medication °b. Most patients will experience some swelling and bruising around the hernia(s) such as the bellybutton, groins, or old incisions.  Ice packs or heating pads (30-60 minutes up to 6 times a day) will help. Use ice for the first few days to help decrease swelling and bruising, then switch to heat to help relax tight/sore spots and speed recovery.  Some people prefer to use ice alone, heat alone, alternating between ice & heat.  Experiment to what works for you.  Swelling and bruising can take  several weeks to resolve.   °c. It is helpful to take an over-the-counter pain medication regularly for the first few weeks.  Choose one of the following that works best for you: °i. Naproxen (Aleve, etc)  Two 220mg tabs twice a day °ii. Ibuprofen (Advil, etc) Three 200mg tabs four times a day (every meal & bedtime) °iii. Acetaminophen (Tylenol, etc) 325-650mg four times a day (every meal & bedtime) °d. A  prescription for pain medication should be given to you upon discharge.  Take your pain medication as prescribed.  °i. If you are having problems/concerns with the prescription medicine (does not control pain, nausea, vomiting, rash, itching, etc), please call us [phone] to see if we need to switch you to a different pain medicine that will work better for you and/or control your side effect better. °ii. If you need a refill on your pain medication, please contact your pharmacy.  They will contact our office to request authorization. Prescriptions will not be filled after 5 pm or on week-ends. °4. Avoid getting constipated.  Between the surgery and the pain medications, it is common to experience some constipation.  Increasing fluid intake and taking a fiber supplement (such as Metamucil, Citrucel, FiberCon, MiraLax, etc) 1-2 times a day regularly will usually help prevent this problem from occurring.  A mild laxative (prune juice, Milk of Magnesia, MiraLax, etc) should be taken according to package directions if there are no bowel movements after 48 hours.   °5. Wash / shower every day.  You may shower over the dressings as they are waterproof.   °6. Remove   your waterproof bandages 5 days after surgery.  You may leave the incision open to air.  You may replace a dressing/Band-Aid to cover the incision for comfort if you wish.  Continue to shower over incision(s) after the dressing is off. ° ° ° °7. ACTIVITIES as tolerated:   °a. You may resume regular (light) daily activities beginning the next day--such  as daily self-care, walking, climbing stairs--gradually increasing activities as tolerated.  If you can walk 30 minutes without difficulty, it is safe to try more intense activity such as jogging, treadmill, bicycling, low-impact aerobics, swimming, etc. °b. Save the most intensive and strenuous activity for last such as sit-ups, heavy lifting, contact sports, etc  Refrain from any heavy lifting or straining until you are off narcotics for pain control.   °c. DO NOT PUSH THROUGH PAIN.  Let pain be your guide: If it hurts to do something, don't do it.  Pain is your body warning you to avoid that activity for another week until the pain goes down. °d. You may drive when you are no longer taking prescription pain medication, you can comfortably wear a seatbelt, and you can safely maneuver your car and apply brakes. °e. You may have sexual intercourse when it is comfortable.  °8. FOLLOW UP in our office °a. Please call [facility] at [phone] to set up an appointment to see your surgeon in the office for a follow-up appointment approximately 2-3 weeks after your surgery. °b. Make sure that you call for this appointment the day you arrive home to insure a convenient appointment time. °9.  IF YOU HAVE DISABILITY OR FAMILY LEAVE FORMS, BRING THEM TO THE OFFICE FOR PROCESSING.  DO NOT GIVE THEM TO YOUR DOCTOR. ° °WHEN TO CALL US [phone]: °1. Poor pain control °2. Reactions / problems with new medications (rash/itching, nausea, etc)  °3. Fever over 101.5 F (38.5 C) °4. Inability to urinate °5. Nausea and/or vomiting °6. Worsening swelling or bruising °7. Continued bleeding from incision. °8. Increased pain, redness, or drainage from the incision ° ° The clinic staff is available to answer your questions during regular business hours (8:30am-5pm).  Please don’t hesitate to call and ask to speak to one of our nurses for clinical concerns.  ° If you have a medical emergency, go to the nearest emergency room or call  911. ° A surgeon from [facility] is always on call at the hospitals in [place] ° °[facility] °[address], [address], [place], [place]  [address] ? ° [address] [address], , [place]   [address] °MAIN: [phone] ? TOLL FREE: [phone] ? FAX: [phone] °[email or web address] ° ° °

## 2015-09-09 NOTE — Anesthesia Preprocedure Evaluation (Signed)
Anesthesia Evaluation  Patient identified by MRN, date of birth, ID band Patient awake    Reviewed: Allergy & Precautions, H&P , NPO status , Patient's Chart, lab work & pertinent test results, reviewed documented beta blocker date and time   History of Anesthesia Complications (+) PONV  Airway Mallampati: II  TM Distance: >3 FB Neck ROM: full    Dental no notable dental hx. (+) Dental Advisory Given, Teeth Intact   Pulmonary neg pulmonary ROS,    Pulmonary exam normal breath sounds clear to auscultation       Cardiovascular Exercise Tolerance: Good negative cardio ROS Normal cardiovascular exam Rhythm:regular Rate:Normal     Neuro/Psych negative neurological ROS  negative psych ROS   GI/Hepatic negative GI ROS, Neg liver ROS,   Endo/Other  negative endocrine ROS  Renal/GU negative Renal ROS  negative genitourinary   Musculoskeletal   Abdominal   Peds  Hematology negative hematology ROS (+) anemia , hgb 8.5   Anesthesia Other Findings   Reproductive/Obstetrics negative OB ROS                             Anesthesia Physical Anesthesia Plan  ASA: II  Anesthesia Plan: General   Post-op Pain Management:    Induction: Intravenous  Airway Management Planned: Oral ETT  Additional Equipment:   Intra-op Plan:   Post-operative Plan: Extubation in OR  Informed Consent: I have reviewed the patients History and Physical, chart, labs and discussed the procedure including the risks, benefits and alternatives for the proposed anesthesia with the patient or authorized representative who has indicated his/her understanding and acceptance.   Dental Advisory Given  Plan Discussed with: CRNA  Anesthesia Plan Comments:         Anesthesia Quick Evaluation

## 2015-09-10 ENCOUNTER — Encounter (HOSPITAL_COMMUNITY): Payer: Self-pay | Admitting: Surgery

## 2015-09-10 DIAGNOSIS — K432 Incisional hernia without obstruction or gangrene: Secondary | ICD-10-CM | POA: Diagnosis not present

## 2015-09-10 LAB — CBC
HCT: 26.3 % — ABNORMAL LOW (ref 36.0–46.0)
Hemoglobin: 7.9 g/dL — ABNORMAL LOW (ref 12.0–15.0)
MCH: 20.6 pg — ABNORMAL LOW (ref 26.0–34.0)
MCHC: 30 g/dL (ref 30.0–36.0)
MCV: 68.7 fL — ABNORMAL LOW (ref 78.0–100.0)
PLATELETS: 259 10*3/uL (ref 150–400)
RBC: 3.83 MIL/uL — AB (ref 3.87–5.11)
RDW: 20.3 % — ABNORMAL HIGH (ref 11.5–15.5)
WBC: 10.4 10*3/uL (ref 4.0–10.5)

## 2015-09-10 LAB — BASIC METABOLIC PANEL
Anion gap: 5 (ref 5–15)
BUN: 8 mg/dL (ref 6–20)
CALCIUM: 8.8 mg/dL — AB (ref 8.9–10.3)
CO2: 23 mmol/L (ref 22–32)
CREATININE: 0.64 mg/dL (ref 0.44–1.00)
Chloride: 108 mmol/L (ref 101–111)
GFR calc non Af Amer: >60 mL/min (ref >60)
Glucose, Bld: 112 mg/dL — ABNORMAL HIGH (ref 65–99)
Potassium: 4.1 mmol/L (ref 3.5–5.1)
SODIUM: 136 mmol/L (ref 135–145)

## 2015-09-10 NOTE — Progress Notes (Signed)
Discharge instructions given to patient along with prescriptions.  Questions answered 

## 2015-09-10 NOTE — Discharge Summary (Signed)
Physician Discharge Summary  Patient ID: Marcia Carter MRN: [id number] DOB/AGE: 04-16-1965 50 y.o.  Admit date: 09/09/2015 Discharge date: 09/10/2015  Patient Care Team: Eloise Levels, NP as PCP - General (Nurse Practitioner) Michael Boston, MD as Consulting Physician (General Surgery)  Admission Diagnoses: Active Problems:   Recurrent ventral incisional hernia   Discharge Diagnoses:  Active Problems:   Recurrent ventral incisional hernia   POST-OPERATIVE DIAGNOSIS:   ventral hernia  SURGERY:  09/09/2015  Procedure(s): LAPAROSCOPIC LYSIS OF ADHESIONS LAPAROSCOPIC INCISIONAL HERNIA REPAIR INSERTION OF MESH  SURGEON:    Surgeon(s): Michael Boston, MD  Consults: None  Hospital Course:   The patient underwent the surgery above.  Postoperatively, the patient gradually mobilized and advanced to a solid diet.  Pain and other symptoms were treated aggressively.    By the time of discharge, the patient was walking well the hallways, eating food, having flatus.  Pain was well-controlled on an oral medications.  Based on meeting discharge criteria and continuing to recover, I felt it was safe for the patient to be discharged from the hospital to further recover with close followup. Postoperative recommendations were discussed in detail.  They are written as well.   Significant Diagnostic Studies:  Results for orders placed or performed during the hospital encounter of 09/09/15 (from the past 72 hour(s))  Basic metabolic panel     Status: Abnormal   Collection Time: 09/10/15  5:01 AM  Result Value Ref Range   Sodium 136 135 - 145 mmol/L   Potassium 4.1 3.5 - 5.1 mmol/L   Chloride 108 101 - 111 mmol/L   CO2 23 22 - 32 mmol/L   Glucose, Bld 112 (H) 65 - 99 mg/dL   BUN 8 6 - 20 mg/dL   Creatinine, Ser 0.64 0.44 - 1.00 mg/dL   Calcium 8.8 (L) 8.9 - 10.3 mg/dL   GFR calc non Af Amer >60 >60 mL/min   GFR calc Af Amer >60 >60 mL/min    Comment: (NOTE) The eGFR has been  calculated using the CKD EPI equation. This calculation has not been validated in all clinical situations. eGFR's persistently <60 mL/min signify possible Chronic Kidney Disease.    Anion gap 5 5 - 15  CBC     Status: Abnormal   Collection Time: 09/10/15  5:01 AM  Result Value Ref Range   WBC 10.4 4.0 - 10.5 K/uL   RBC 3.83 (L) 3.87 - 5.11 MIL/uL   Hemoglobin 7.9 (L) 12.0 - 15.0 g/dL   HCT 26.3 (L) 36.0 - 46.0 %   MCV 68.7 (L) 78.0 - 100.0 fL   MCH 20.6 (L) 26.0 - 34.0 pg   MCHC 30.0 30.0 - 36.0 g/dL   RDW 20.3 (H) 11.5 - 15.5 %   Platelets 259 150 - 400 K/uL    No results found.  Discharge Exam: Blood pressure (!) 105/59, pulse 100, temperature 98.7 F (37.1 C), temperature source Oral, resp. rate 15, height 5' 4.5" (1.638 m), weight 84.4 kg (186 lb), last menstrual period 08/11/2015, SpO2 100 %.  General: Pt awake/alert/oriented x4 in no major acute distress Eyes: PERRL, normal EOM. Sclera nonicteric Neuro: CN II-XII intact w/o focal sensory/motor deficits. Lymph: No head/neck/groin lymphadenopathy Psych:  No delerium/psychosis/paranoia HENT: Normocephalic, Mucus membranes moist.  No thrush Neck: Supple, No tracheal deviation Chest: No pain.  Good respiratory excursion. CV:  Pulses intact.  Regular rhythm MS: Normal AROM mjr joints.  No obvious deformity Abdomen: Soft, Nondistended.  TTP at incisions.  Dressings  c/d/i..  No incarcerated hernias. Ext:  SCDs BLE.  No significant edema.  No cyanosis Skin: No petechiae / purpura  Discharged Condition: good   Past Medical History:  Diagnosis Date  . Abdominal hernia   . Bilateral lower extremity edema   . Influenza   . PONV (postoperative nausea and vomiting)   . Varicose veins     Past Surgical History:  Procedure Laterality Date  . CESAREAN SECTION     x 3  . HERNIA REPAIR     x 2   abdominal    Social History   Social History  . Marital status: Married    Spouse name: N/A  . Number of children: N/A  .  Years of education: N/A   Occupational History  . Not on file.   Social History Main Topics  . Smoking status: Never Smoker  . Smokeless tobacco: Never Used  . Alcohol use No  . Drug use: No  . Sexual activity: Not on file   Other Topics Concern  . Not on file   Social History Narrative   Originally from [place].  Lived in [place] several decades   Arabic translator    History reviewed. No pertinent family history.  Current Facility-Administered Medications  Medication Dose Route Frequency Provider Last Rate Last Dose  . acetaminophen (TYLENOL) suppository 650 mg  650 mg Rectal Q6H PRN Michael Boston, MD      . acetaminophen (TYLENOL) tablet 325-650 mg  325-650 mg Oral Q6H PRN Michael Boston, MD      . alum & mag hydroxide-simeth (MAALOX/MYLANTA) 200-200-20 MG/5ML suspension 30 mL  30 mL Oral Q6H PRN Michael Boston, MD      . bisacodyl (DULCOLAX) suppository 10 mg  10 mg Rectal Q12H PRN Michael Boston, MD      . diphenhydrAMINE (BENADRYL) 12.5 MG/5ML elixir 12.5 mg  12.5 mg Oral Q6H PRN Michael Boston, MD       Or  . diphenhydrAMINE (BENADRYL) injection 12.5 mg  12.5 mg Intravenous Q6H PRN Michael Boston, MD      . diphenhydrAMINE (BENADRYL) injection 12.5-25 mg  12.5-25 mg Intravenous Q6H PRN Michael Boston, MD      . enoxaparin (LOVENOX) injection 40 mg  40 mg Subcutaneous Q24H Michael Boston, MD   40 mg at 09/10/15 0741  . hydrALAZINE (APRESOLINE) injection 10 mg  10 mg Intravenous Q2H PRN Michael Boston, MD      . HYDROmorphone (DILAUDID) injection 0.5-2 mg  0.5-2 mg Intravenous Q1H PRN Michael Boston, MD   1 mg at 09/10/15 0151  . lactated ringers bolus 1,000 mL  1,000 mL Intravenous Q8H PRN Michael Boston, MD      . lactated ringers infusion   Intravenous Continuous Michael Boston, MD 50 mL/hr at 09/09/15 2143 50 mL/hr at 09/09/15 2143  . lip balm (CARMEX) ointment 1 application  1 application Topical BID Michael Boston, MD   1 application at 99/77/41 2030  . magic mouthwash  15 mL Oral QID PRN  Michael Boston, MD      . menthol-cetylpyridinium (CEPACOL) lozenge 3 mg  1 lozenge Oral PRN Michael Boston, MD      . methocarbamol (ROBAXIN) 1,000 mg in dextrose 5 % 50 mL IVPB  1,000 mg Intravenous Q6H PRN Michael Boston, MD   1,000 mg at 09/09/15 2214  . methocarbamol (ROBAXIN) tablet 500 mg  500 mg Oral Q6H PRN Michael Boston, MD      . metoprolol (LOPRESSOR) injection 5 mg  5  mg Intravenous Q6H PRN Michael Boston, MD      . metoprolol tartrate (LOPRESSOR) tablet 12.5 mg  12.5 mg Oral Q12H PRN Michael Boston, MD      . multivitamin with minerals tablet 1 tablet  1 tablet Oral Daily PRN Michael Boston, MD      . naproxen (NAPROSYN) tablet 500 mg  500 mg Oral BID WC Michael Boston, MD   500 mg at 09/10/15 0741  . ondansetron (ZOFRAN-ODT) disintegrating tablet 4 mg  4 mg Oral Q6H PRN Michael Boston, MD       Or  . ondansetron [facility]) injection 4 mg  4 mg Intravenous Q6H PRN Michael Boston, MD      . oxyCODONE (Oxy IR/ROXICODONE) immediate release tablet 5-10 mg  5-10 mg Oral Q4H PRN Michael Boston, MD   5 mg at 09/10/15 0601  . phenol (CHLORASEPTIC) mouth spray 2 spray  2 spray Mouth/Throat PRN Michael Boston, MD      . prochlorperazine (COMPAZINE) injection 5-10 mg  5-10 mg Intravenous Q4H PRN Michael Boston, MD         Allergies  Allergen Reactions  . Penicillins Itching and Rash    Has patient had a PCN reaction causing immediate rash, facial/tongue/throat swelling, SOB or lightheadedness with hypotension: Yes Has patient had a PCN reaction causing severe rash involving mucus membranes or skin necrosis: No Has patient had a PCN reaction that required hospitalization No Has patient had a PCN reaction occurring within the last 10 years: No If all of the above answers are "NO", then may proceed with Cephalosporin use.      Disposition:   Discharge Instructions    Call MD for:    Complete by:  As directed   Temperature > 101.54F   Call MD for:    Complete by:  As directed   Temperature > 101.54F   Call MD  for:  extreme fatigue    Complete by:  As directed   Call MD for:  extreme fatigue    Complete by:  As directed   Call MD for:  hives    Complete by:  As directed   Call MD for:  hives    Complete by:  As directed   Call MD for:  persistant nausea and vomiting    Complete by:  As directed   Call MD for:  persistant nausea and vomiting    Complete by:  As directed   Call MD for:  redness, tenderness, or signs of infection (pain, swelling, redness, odor or green/yellow discharge around incision site)    Complete by:  As directed   Call MD for:  redness, tenderness, or signs of infection (pain, swelling, redness, odor or green/yellow discharge around incision site)    Complete by:  As directed   Call MD for:  severe uncontrolled pain    Complete by:  As directed   Call MD for:  severe uncontrolled pain    Complete by:  As directed   Diet - low sodium heart healthy    Complete by:  As directed   Start with bland, low residue diet for a few days, then advance to a heart healthy (low fat, high fiber) diet.  If you feel nauseated or constipated, simplify to a liquid only diet for 48 hours until you are feeling better (no more nausea, farting/passing gas, having a bowel movement, etc...).  If you cannot tolerate even drinking liquids, or feeling worse, let your surgeon know or go  to the Emergency Department for help.   Discharge instructions    Complete by:  As directed   Please see discharge instruction sheets.   Also refer to any handouts/printouts that may have been given from the [facility] surgery office (if you visited [place] there before surgery) Please call our office if you have any questions or concerns [phone]   Discharge instructions    Complete by:  As directed   Please see discharge instruction sheets.   Also refer to any handouts/printouts that may have been given from the [facility] surgery office (if you visited [place] there before surgery) Please call our office if you have any questions or concerns  [phone]   Discharge wound care:    Complete by:  As directed   If you have closed incisions: Shower and bathe over these incisions with soap and water every day.  It is OK to wash over the dressings: they are waterproof. Remove all surgical dressings on postoperative day #3.  You do not need to replace dressings over the closed incisions unless you feel more comfortable with a Band-Aid covering it.   If you have an open wound: That requires packing, so please see wound care instructions.   In general, remove all dressings, wash wound with soap and water and then replace with saline moistened gauze.  Do the dressing change at least every day.    Please call our office [phone] if you have further questions.   Discharge wound care:    Complete by:  As directed   If you have closed incisions: Shower and bathe over these incisions with soap and water every day.  It is OK to wash over the dressings: they are waterproof. Remove all surgical dressings on postoperative day #3.  You do not need to replace dressings over the closed incisions unless you feel more comfortable with a Band-Aid covering it.   If you have an open wound: That requires packing, so please see wound care instructions.   In general, remove all dressings, wash wound with soap and water and then replace with saline moistened gauze.  Do the dressing change at least every day.    Please call our office [phone] if you have further questions.   Driving Restrictions    Complete by:  As directed   No driving until off narcotics and can safely swerve away without pain during an emergency   Driving Restrictions    Complete by:  As directed   No driving until off narcotics and can safely swerve away without pain during an emergency   Increase activity slowly    Complete by:  As directed   Increase activity slowly    Complete by:  As directed   Lifting restrictions    Complete by:  As directed   Avoid heavy lifting  initially, <20 pounds at first.   Do not push through pain.   You have no specific weight limit: If it hurts to do, DON'T DO IT.    If you feel no pain, you are not injuring anything.  Pain will protect you from injury.   Coughing and sneezing are far more stressful to your incision than any lifting.   Avoid resuming heavy lifting (>50 pounds) or other intense activity until off all narcotic pain medications.   When want to exercise more, give yourself 2 weeks to gradually get back to full intense exercise/activity.   Lifting restrictions    Complete by:  As directed  Avoid heavy lifting initially, <20 pounds at first.   Do not push through pain.   You have no specific weight limit: If it hurts to do, DON'T DO IT.    If you feel no pain, you are not injuring anything.  Pain will protect you from injury.   Coughing and sneezing are far more stressful to your incision than any lifting.   Avoid resuming heavy lifting (>50 pounds) or other intense activity until off all narcotic pain medications.   When want to exercise more, give yourself 2 weeks to gradually get back to full intense exercise/activity.   May shower / Bathe    Complete by:  As directed   [place].  It is fine for dressings or wounds to be washed/rinsed.  Use gentle soap & water.  This will help the incisions and/or wounds get clean & minimize infection.   May shower / Bathe    Complete by:  As directed   [place].  It is fine for dressings or wounds to be washed/rinsed.  Use gentle soap & water.  This will help the incisions and/or wounds get clean & minimize infection.   May walk up steps    Complete by:  As directed   May walk up steps    Complete by:  As directed   Sexual Activity Restrictions    Complete by:  As directed   Sexual activity as tolerated.  Do not push through pain.  Pain will protect you from injury.   Sexual Activity Restrictions    Complete by:  As directed   Sexual activity as tolerated.  Do  not push through pain.  Pain will protect you from injury.   Walk with assistance    Complete by:  As directed   Walk over an hour a day.  May use a walker/cane/companion to help with balance and stamina.   Walk with assistance    Complete by:  As directed   Walk over an hour a day.  May use a walker/cane/companion to help with balance and stamina.       Medication List    TAKE these medications   methocarbamol 750 MG tablet Commonly known as:  ROBAXIN Take 1 tablet (750 mg total) by mouth 4 (four) times daily as needed (use for muscle cramps/pain).   multivitamin tablet Take 1 tablet by mouth daily as needed (for supplementation).   oxyCODONE 5 MG immediate release tablet Commonly known as:  Oxy IR/ROXICODONE Take 1-2 tablets (5-10 mg total) by mouth every 4 (four) hours as needed for moderate pain, severe pain or breakthrough pain.      Follow-up Information    Adin Hector., MD.   Specialty:  General Surgery Contact information: [address] [place] [address] [phone]        Schedule an appointment as soon as possible for a visit in 3 weeks to follow up.   Why:  To follow up after your operation, To follow up after your hospital stay           Signed: Morton Peters, M.D., F.A.C.S. Gastrointestinal and Minimally Invasive Surgery [facility] [address] [place] [address] [phone] Main / Paging   09/10/2015, 7:47 AM

## 2016-12-13 ENCOUNTER — Other Ambulatory Visit: Payer: Self-pay | Admitting: Family Medicine

## 2016-12-13 DIAGNOSIS — I8001 Phlebitis and thrombophlebitis of superficial vessels of right lower extremity: Secondary | ICD-10-CM

## 2016-12-15 ENCOUNTER — Ambulatory Visit
Admission: RE | Admit: 2016-12-15 | Discharge: 2016-12-15 | Disposition: A | Discharge status: Home / Self Care | Payer: BC Managed Care – PPO | Source: Ambulatory Visit | Attending: Family Medicine | Admitting: Family Medicine

## 2016-12-15 DIAGNOSIS — I8001 Phlebitis and thrombophlebitis of superficial vessels of right lower extremity: Secondary | ICD-10-CM

## 2018-09-25 ENCOUNTER — Other Ambulatory Visit: Payer: Self-pay

## 2018-09-25 DIAGNOSIS — Z20822 Contact with and (suspected) exposure to covid-19: Secondary | ICD-10-CM

## 2018-09-26 LAB — NOVEL CORONAVIRUS, NAA: SARS-CoV-2, NAA: NOT DETECTED

## 2020-02-19 ENCOUNTER — Ambulatory Visit (HOSPITAL_COMMUNITY)
Admission: EM | Admit: 2020-02-19 | Discharge: 2020-02-19 | Disposition: A | Discharge status: Home / Self Care | Payer: Self-pay | Attending: Family Medicine | Admitting: Family Medicine

## 2020-02-19 ENCOUNTER — Other Ambulatory Visit: Payer: Self-pay

## 2020-02-19 ENCOUNTER — Ambulatory Visit: Payer: Self-pay

## 2020-02-19 ENCOUNTER — Other Ambulatory Visit: Payer: Self-pay | Admitting: Family Medicine

## 2020-02-19 DIAGNOSIS — M25531 Pain in right wrist: Secondary | ICD-10-CM

## 2020-02-19 DIAGNOSIS — M25562 Pain in left knee: Secondary | ICD-10-CM

## 2020-08-15 LAB — EXTERNAL GENERIC LAB PROCEDURE: COLOGUARD: NEGATIVE

## 2021-01-26 ENCOUNTER — Other Ambulatory Visit: Payer: Self-pay | Admitting: *Deleted

## 2021-01-26 DIAGNOSIS — I8390 Asymptomatic varicose veins of unspecified lower extremity: Secondary | ICD-10-CM

## 2021-02-10 DIAGNOSIS — E669 Obesity, unspecified: Secondary | ICD-10-CM | POA: Insufficient documentation

## 2021-02-10 DIAGNOSIS — Z0000 Encounter for general adult medical examination without abnormal findings: Secondary | ICD-10-CM | POA: Insufficient documentation

## 2021-02-10 DIAGNOSIS — Z8616 Personal history of COVID-19: Secondary | ICD-10-CM | POA: Insufficient documentation

## 2021-02-10 DIAGNOSIS — M542 Cervicalgia: Secondary | ICD-10-CM | POA: Insufficient documentation

## 2021-02-10 DIAGNOSIS — L989 Disorder of the skin and subcutaneous tissue, unspecified: Secondary | ICD-10-CM | POA: Insufficient documentation

## 2021-02-10 DIAGNOSIS — G5601 Carpal tunnel syndrome, right upper limb: Secondary | ICD-10-CM | POA: Insufficient documentation

## 2021-02-10 DIAGNOSIS — R600 Localized edema: Secondary | ICD-10-CM | POA: Insufficient documentation

## 2021-02-10 DIAGNOSIS — I8000 Phlebitis and thrombophlebitis of superficial vessels of unspecified lower extremity: Secondary | ICD-10-CM | POA: Insufficient documentation

## 2021-02-11 ENCOUNTER — Other Ambulatory Visit: Payer: Self-pay

## 2021-02-11 ENCOUNTER — Ambulatory Visit (HOSPITAL_COMMUNITY)
Admission: RE | Admit: 2021-02-11 | Discharge: 2021-02-11 | Disposition: A | Discharge status: Home / Self Care | Payer: 59 | Source: Ambulatory Visit | Attending: Vascular Surgery | Admitting: Vascular Surgery

## 2021-02-11 ENCOUNTER — Ambulatory Visit (INDEPENDENT_AMBULATORY_CARE_PROVIDER_SITE_OTHER): Payer: 59 | Admitting: Physician Assistant

## 2021-02-11 VITALS — BP 144/96 | HR 84 | Temp 98.1°F | Resp 20 | Ht 64.5 in | Wt 220.1 lb

## 2021-02-11 DIAGNOSIS — I8391 Asymptomatic varicose veins of right lower extremity: Secondary | ICD-10-CM

## 2021-02-11 DIAGNOSIS — I8390 Asymptomatic varicose veins of unspecified lower extremity: Secondary | ICD-10-CM | POA: Insufficient documentation

## 2021-02-11 DIAGNOSIS — I872 Venous insufficiency (chronic) (peripheral): Secondary | ICD-10-CM

## 2021-02-11 DIAGNOSIS — R600 Localized edema: Secondary | ICD-10-CM | POA: Insufficient documentation

## 2021-02-11 NOTE — Progress Notes (Signed)
[facility] [place]   Reason for referral: Swollen right leg  History of Present Illness  Marcia Carter is a 56 y.o. female who presents with chief complaint: swollen leg.  Patient notes, onset of swelling >1 year ago, associated with prolonged standing.  The patient has had no history of DVT, positive history of varicose vein, no history of venous stasis ulcers, no history of  Lymphedema and positive history of skin changes in lower anterior shin with episodes of pin point bleeding on the right LE.  There is no family history of venous disorders.  The patient has  used compression stockings in the past.  Past Medical History:  Diagnosis Date   Abdominal hernia    Bilateral lower extremity edema    Influenza    PONV (postoperative nausea and vomiting)    Varicose veins     Past Surgical History:  Procedure Laterality Date   CESAREAN SECTION     x 3   HERNIA REPAIR     x 2   abdominal   INSERTION OF MESH N/A 09/09/2015   Procedure: INSERTION OF MESH;  Surgeon: Karie SodaSteven Gross, MD;  Location: [facility];  Service: General;  Laterality: N/A;   LAPAROSCOPIC LYSIS OF ADHESIONS N/A 09/09/2015   Procedure: LAPAROSCOPIC LYSIS OF ADHESIONS;  Surgeon: Karie SodaSteven Gross, MD;  Location: [facility];  Service: General;  Laterality: N/A;   VENTRAL HERNIA REPAIR N/A 09/09/2015   Procedure: LAPAROSCOPIC INCISIONAL HERNIA REPAIR;  Surgeon: Karie SodaSteven Gross, MD;  Location: [facility];  Service: General;  Laterality: N/A;    Social History   Socioeconomic History   Marital status: Married    Spouse name: Not on file   Number of children: Not on file   Years of education: Not on file   Highest education level: Not on file  Occupational History   Not on file  Tobacco Use   Smoking status: Never   Smokeless tobacco: Never  Substance and Sexual Activity   Alcohol use: No   Drug use: No   Sexual activity: Not on file  Other Topics Concern   Not on file  Social History Narrative   Originally  from [place].  Lived in [place] several decades   Print production plannerArabic translator   Social Determinants of Health   Financial Resource Strain: Not on file  Food Insecurity: Not on file  Transportation Needs: Not on file  Physical Activity: Not on file  Stress: Not on file  Social Connections: Not on file  Intimate Partner Violence: Not on file    No family history on file.  Current Outpatient Medications on File Prior to Visit  Medication Sig Dispense Refill   Multiple Vitamin (MULTIVITAMIN) tablet Take 1 tablet by mouth daily as needed (for supplementation).     methocarbamol (ROBAXIN) 750 MG tablet Take 1 tablet (750 mg total) by mouth 4 (four) times daily as needed (use for muscle cramps/pain). (Patient not taking: Reported on 02/11/2021) 30 tablet 2   oxyCODONE (OXY IR/ROXICODONE) 5 MG immediate release tablet Take 1-2 tablets (5-10 mg total) by mouth every 4 (four) hours as needed for moderate pain, severe pain or breakthrough pain. (Patient not taking: Reported on 02/11/2021) 40 tablet 0   No current facility-administered medications on file prior to visit.    Allergies as of 02/11/2021 - Review Complete 09/09/2015  Allergen Reaction Noted   Penicillins Itching and Rash      ROS:   General:  No weight loss, Fever, chills  HEENT: No recent headaches, no nasal bleeding, no visual changes, no sore throat  Neurologic: No dizziness, blackouts, seizures. No recent symptoms of stroke or mini- stroke. No recent episodes of slurred speech, or temporary blindness.  Cardiac: No recent episodes of chest pain/pressure, no shortness of breath at rest.  No shortness of breath with exertion.  Denies history of atrial fibrillation or irregular heartbeat  Vascular: No history of rest pain in feet.  No history of claudication.  No history of non-healing ulcer, No history of DVT   Pulmonary: No home oxygen, no productive cough, no hemoptysis,  No asthma or wheezing  Musculoskeletal:  [ ]  Arthritis, [ ]   Low back pain,  [ ]  Joint pain  Hematologic:No history of hypercoagulable state.  No history of easy bleeding.  No history of anemia  Gastrointestinal: No hematochezia or melena,  No gastroesophageal reflux, no trouble swallowing  Urinary: [ ]  chronic Kidney disease, [ ]  on HD - [ ]  MWF or [ ]  TTHS, [ ]  Burning with urination, [ ]  Frequent urination, [ ]  Difficulty urinating;   Skin: No rashes  Psychological: No history of anxiety,  No history of depression  Physical Examination  Vitals:   02/11/21 1507  BP: (!) 144/96  Pulse: 84  Resp: 20  Temp: 98.1 F (36.7 C)  TempSrc: Temporal  SpO2: 97%  Weight: 220 lb 1.6 oz (99.8 kg)  Height: 5' 4.5" (1.638 m)    Body mass index is 37.2 kg/m.  General:  Alert and oriented, no acute distress HEENT: Normal Neck: No bruit or JVD Pulmonary: Clear to auscultation bilaterally Cardiac: Regular Rate and Rhythm without murmur Abdomen: Soft, non-tender, non-distended, no mass, no scars Skin: No rash       Extremity Pulses:  2+ radial, brachial, femoral, dorsalis pedis, posterior tibial pulses bilaterally Musculoskeletal: No deformity or edema  Neurologic: Upper and lower extremity motor 5/5 and symmetric  DATA:    Venous Reflux Times  +--------------+---------+------+-----------+------------+-----------------  ---+   RIGHT          Reflux No Reflux Reflux Time Diameter cms Comments                                           Yes                                                    +--------------+---------+------+-----------+------------+-----------------  ---+   CFV                       yes    >1 second                                       +--------------+---------+------+-----------+------------+-----------------  ---+   FV mid                    yes    >1 second                                       +--------------+---------+------+-----------+------------+-----------------  ---+  Popliteal                 yes    >1  second               rouleaux flow            +--------------+---------+------+-----------+------------+-----------------  ---+   GSV at SFJ                yes     >500 ms      1.61.6                            +--------------+---------+------+-----------+------------+-----------------  ---+   GSV prox thigh            yes     >500 ms       1.0                              +--------------+---------+------+-----------+------------+-----------------  ---+   GSV mid thigh             yes     >500 ms       0.81                             +--------------+---------+------+-----------+------------+-----------------  ---+   GSV dist thigh            yes     >500 ms       0.96                             +--------------+---------+------+-----------+------------+-----------------  ---+   GSV at knee               yes     >500 ms       0.87                             +--------------+---------+------+-----------+------------+-----------------  ---+   GSV prox calf             yes     >500 ms   1.15 / 0.41  tortuous ,  branching   +--------------+---------+------+-----------+------------+-----------------  ---+   GSV mid calf              yes     >500 ms       0.45                             +--------------+---------+------+-----------+------------+-----------------  ---+   SSV Pop Fossa  no                               0.54                             +--------------+---------+------+-----------+------------+-----------------  ---+   SSV prox calf  no                               0.34                             +--------------+---------+------+-----------+------------+-----------------  ---+  SSV mid calf              yes     >500 ms       0.41                             +--------------+---------+------+-----------+------------+-----------------  ---+   AASV p                    yes     >500 ms       0.41                              +--------------+---------+------+-----------+------------+-----------------  ---+    Summary:  Right:  - No evidence of deep vein thrombosis seen in the right lower extremity,  from the common femoral through the popliteal veins.  - Venous reflux is noted in the right common femoral vein.  - Venous reflux is noted in the right sapheno-femoral junction.  - Venous reflux is noted in the right greater saphenous vein in the thigh.  - Venous reflux is noted in the right greater saphenous vein in the calf.  - Venous reflux is noted in the right femoral vein.  - Venous reflux is noted in the right popliteal vein.  - Venous reflux is noted in the right short saphenous vein.  - The right popliteal vein appears dialated      Assessment: Venous reflux right LE based on the venous duplex she has deep and superficial reflux.  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 The vein size is enlarged > 0.4 through out from the SFJ down.  She has had episodes of bleeding x 3 over the right anterior shin where there is a palpable varicosity.   She has excellent palpable pedal pulses B LE.  She is not at risk of limb loss.    Plan: She was fitted with thigh high compression 20-30 mm hg to be worn daily, elevation when at rest and she will start a walking program as well as a healthy diet.  She will f/u with our vein clinic in 3 months to be considered for laser ablation therapy.    Mosetta Pigeon PA-C [facility] [place] Office: [phone]  MD in office [place]

## 2021-02-16 ENCOUNTER — Other Ambulatory Visit: Payer: Self-pay | Admitting: *Deleted

## 2021-02-16 DIAGNOSIS — I8390 Asymptomatic varicose veins of unspecified lower extremity: Secondary | ICD-10-CM

## 2021-02-16 DIAGNOSIS — I872 Venous insufficiency (chronic) (peripheral): Secondary | ICD-10-CM

## 2021-03-14 ENCOUNTER — Encounter (HOSPITAL_COMMUNITY): Payer: Self-pay

## 2021-03-14 ENCOUNTER — Ambulatory Visit (HOSPITAL_COMMUNITY)
Admission: EM | Admit: 2021-03-14 | Discharge: 2021-03-14 | Disposition: A | Discharge status: Home / Self Care | Payer: 59 | Attending: Emergency Medicine | Admitting: Emergency Medicine

## 2021-03-14 ENCOUNTER — Other Ambulatory Visit: Payer: Self-pay

## 2021-03-14 DIAGNOSIS — R350 Frequency of micturition: Secondary | ICD-10-CM | POA: Insufficient documentation

## 2021-03-14 DIAGNOSIS — H66001 Acute suppurative otitis media without spontaneous rupture of ear drum, right ear: Secondary | ICD-10-CM | POA: Insufficient documentation

## 2021-03-14 DIAGNOSIS — H60393 Other infective otitis externa, bilateral: Secondary | ICD-10-CM | POA: Diagnosis present

## 2021-03-14 LAB — POCT URINALYSIS DIPSTICK, ED / UC
Bilirubin Urine: NEGATIVE
Glucose, UA: NEGATIVE mg/dL
Hgb urine dipstick: NEGATIVE
Ketones, ur: NEGATIVE mg/dL
Leukocytes,Ua: NEGATIVE
Nitrite: NEGATIVE
Protein, ur: NEGATIVE mg/dL
Specific Gravity, Urine: 1.01 (ref 1.005–1.030)
Urobilinogen, UA: 0.2 mg/dL (ref 0.0–1.0)
pH: 6.5 (ref 5.0–8.0)

## 2021-03-14 LAB — POCT RAPID STREP A, ED / UC: Streptococcus, Group A Screen (Direct): NEGATIVE

## 2021-03-14 LAB — POC INFLUENZA A AND B ANTIGEN (URGENT CARE ONLY)
INFLUENZA A ANTIGEN, POC: NEGATIVE
INFLUENZA B ANTIGEN, POC: NEGATIVE

## 2021-03-14 MED ORDER — DOXYCYCLINE HYCLATE 100 MG PO CAPS: 100.0000 mg | ORAL_CAPSULE | Freq: Two times a day (BID) | ORAL | 0 refills | Status: AC

## 2021-03-14 MED ORDER — CIPROFLOXACIN-DEXAMETHASONE 0.3-0.1 % OT SUSP: OTIC | 0 refills | Status: AC

## 2021-03-14 NOTE — ED Provider Notes (Signed)
[facility]    CSN: [id number] Arrival date & time: 03/14/21  1141    HISTORY   Chief Complaint  Patient presents with   Otalgia   Sore Throat   Headache   HPI Marcia Carter is a 56 y.o. female. Pt presents with bilateral ear pain, sore throat, and headache for past few days.  Patient states ears have been very itchy, states she has placed some Vaseline in them with minimal improvement, patient states she feels like her ears are both infected.  Patient denies difficulty maintaining airway, has some pain with swallowing.  Patient states she has felt feverish, has had body aches as well.  Patient states she is also noticed that she has been urinating more frequently recently.  Patient denies burning with urination, increased urgency.  The history is provided by the patient.  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 02/10/2021   Recurrent ventral incisional hernia 09/09/2015   HYPERLIPIDEMIA 09/26/2006   VARICOSE VEINS, LOWER EXTREMITIES 09/26/2006   ABDOMINAL INCISIONAL HERNIA 09/26/2006   Past Surgical History:  Procedure Laterality Date   CESAREAN SECTION     x 3   HERNIA REPAIR     x 2   abdominal   INSERTION OF MESH N/A 09/09/2015   Procedure: INSERTION OF MESH;  Surgeon: Karie SodaSteven Gross, MD;  Location: [facility];  Service: General;  Laterality: N/A;   LAPAROSCOPIC LYSIS OF ADHESIONS N/A 09/09/2015   Procedure: LAPAROSCOPIC LYSIS OF ADHESIONS;   Surgeon: Karie SodaSteven Gross, MD;  Location: [facility];  Service: General;  Laterality: N/A;   VENTRAL HERNIA REPAIR N/A 09/09/2015   Procedure: LAPAROSCOPIC INCISIONAL HERNIA REPAIR;  Surgeon: Karie SodaSteven Gross, MD;  Location: [facility];  Service: General;  Laterality: N/A;   OB History   No obstetric history on file.    Home Medications    Prior to Admission medications   Medication Sig Start Date End Date Taking? Authorizing Provider  Multiple Vitamin (MULTIVITAMIN) tablet Take 1 tablet by mouth daily as needed (for supplementation).    [provider]   Family History History reviewed. No pertinent family history. Social History Social History   Tobacco Use   Smoking status: Never   Smokeless tobacco: Never  Substance Use Topics   Alcohol use: No   Drug use: No   Allergies   Penicillins  Review of Systems Review of Systems Pertinent findings noted in history of present illness.   Physical Exam Triage Vital Signs ED Triage Vitals  Enc Vitals Group     BP 12/04/20 0827 (!) 147/82     Pulse Rate 12/04/20 0827 72     Resp 12/04/20 0827 18     Temp 12/04/20 0827 98.3 F (36.8 C)     Temp Source 12/04/20 0827 Oral     SpO2 12/04/20 0827 98 %     Weight --      Height --      Head  Circumference --      Peak Flow --      Pain Score 12/04/20 0826 5     Pain Loc --      Pain Edu? --      Excl. in GC? --   No data found.  Updated Vital Signs BP (!) 153/89 (BP Location: Right Arm)    Pulse (!) 104    Temp 99.7 F (37.6 C) (Oral)    Resp 20    LMP 08/11/2015 (Exact Date)    SpO2 98%   Physical Exam Vitals and nursing note reviewed.  Constitutional:      General: She is not in acute distress.    Appearance: Normal appearance. She is not ill-appearing.  HENT:     Head: Normocephalic and atraumatic.     Salivary Glands: Right salivary gland is not diffusely enlarged or tender. Left salivary gland is not diffusely enlarged or tender.     Right Ear: Hearing and external ear  normal. No drainage. No middle ear effusion. There is no impacted cerumen. Tympanic membrane is injected, erythematous and bulging (Purulent fluid).     Left Ear: Hearing and external ear normal. No drainage.  No middle ear effusion. There is no impacted cerumen. Tympanic membrane is not erythematous or bulging.     Ears:     Comments: Bilateral EACs erythematous, mild cerumen present but not obstructing    Nose: Nose normal. No nasal deformity, septal deviation, mucosal edema, congestion or rhinorrhea.     Right Turbinates: Not enlarged, swollen or pale.     Left Turbinates: Not enlarged, swollen or pale.     Right Sinus: No maxillary sinus tenderness or frontal sinus tenderness.     Left Sinus: No maxillary sinus tenderness or frontal sinus tenderness.     Mouth/Throat:     Lips: Pink. No lesions.     Mouth: Mucous membranes are moist. No oral lesions.     Pharynx: Oropharynx is clear. Uvula midline. No posterior oropharyngeal erythema or uvula swelling.     Tonsils: No tonsillar exudate. 0 on the right. 0 on the left.  Eyes:     General: Lids are normal.        Right eye: No discharge.        Left eye: No discharge.     Extraocular Movements: Extraocular movements intact.     Conjunctiva/sclera: Conjunctivae normal.     Right eye: Right conjunctiva is not injected.     Left eye: Left conjunctiva is not injected.  Neck:     Trachea: Trachea and phonation normal.  Cardiovascular:     Rate and Rhythm: Normal rate and regular rhythm.     Pulses: Normal pulses.     Heart sounds: Normal heart sounds. No murmur heard.   No friction rub. No gallop.  Pulmonary:     Effort: Pulmonary effort is normal. No accessory muscle usage, prolonged expiration or respiratory distress.     Breath sounds: Normal breath sounds. No stridor, decreased air movement or transmitted upper airway sounds. No decreased breath sounds, wheezing, rhonchi or rales.  Chest:     Chest wall: No tenderness.   Musculoskeletal:        General: Normal range of motion.     Cervical back: Normal range of motion and neck supple. Normal range of motion.  Lymphadenopathy:     Cervical: No cervical adenopathy.  Skin:    General: Skin is warm and dry.     Findings:  No erythema or rash.  Neurological:     General: No focal deficit present.     Mental Status: She is alert and oriented to person, place, and time.  Psychiatric:        Mood and Affect: Mood normal.        Behavior: Behavior normal.    Visual Acuity Right Eye Distance:   Left Eye Distance:   Bilateral Distance:    Right Eye Near:   Left Eye Near:    Bilateral Near:     UC Couse / Diagnostics / Procedures:    EKG  Radiology No results found.  Procedures Procedures (including critical care time)  UC Diagnoses / Final Clinical Impressions(s)   I have reviewed the triage vital signs and the nursing notes.  Pertinent labs & imaging results that were available during my care of the patient were reviewed by me and considered in my medical decision making (see chart for details).   Final diagnoses:  Increased frequency of urination  Right acute suppurative otitis media  Infective otitis externa of both ears   Urine dip today was normal, will send urine for culture given patient's reported symptom of increased frequency of urination.  Patient advised that we will notify her of the urine culture once received and antibiotics will be prescribed as needed.  Patient provided doxycycline given history of anaphylaxis to penicillin for acute otitis media in her right ear.  Patient also provided with Ciprodex for relief of itching and discomfort in both ears.  Return precautions advised.  Patient provided with a note for work.   ED Prescriptions     Medication Sig Dispense Auth. Provider   doxycycline (VIBRAMYCIN) 100 MG capsule Take 1 capsule (100 mg total) by mouth 2 (two) times daily. 20 capsule Theadora Rama Scales, PA-C    ciprofloxacin-dexamethasone (CIPRODEX) OTIC suspension Instill 4 drops in each ear twice daily for 7 full days. 7.5 mL Theadora Rama Scales, PA-C      PDMP not reviewed this encounter.  Pending results:  Labs Reviewed  URINE CULTURE  POCT RAPID STREP A, ED / UC  POC INFLUENZA A AND B ANTIGEN (URGENT CARE ONLY)  POCT URINALYSIS DIPSTICK, ED / UC    Medications Ordered in UC: Medications - No data to display  Disposition Upon Discharge:  Condition: stable for discharge home Home: take medications as prescribed; routine discharge instructions as discussed; follow up as advised.  Patient presented with an acute illness with associated systemic symptoms and significant discomfort requiring urgent management. In my opinion, this is a condition that a prudent lay person (someone who possesses an average knowledge of health and medicine) may potentially expect to result in complications if not addressed urgently such as respiratory distress, impairment of bodily function or dysfunction of bodily organs.   Routine symptom specific, illness specific and/or disease specific instructions were discussed with the patient and/or caregiver at length.   As such, the patient has been evaluated and assessed, work-up was performed and treatment was provided in alignment with urgent care protocols and evidence based medicine.  Patient/parent/caregiver has been advised that the patient may require follow up for further testing and treatment if the symptoms continue in spite of treatment, as clinically indicated and appropriate.  If the patient was tested for COVID-19, Influenza and/or RSV, then the patient/parent/guardian was advised to isolate at home pending the results of his/her diagnostic coronavirus test and potentially longer if theyre positive. I have also advised pt that if his/her  COVID-19 test returns positive, it's recommended to self-isolate for at least 10 days after symptoms first appeared AND  until fever-free for 24 hours without fever reducer AND other symptoms have improved or resolved. Discussed self-isolation recommendations as well as instructions for household member/close contacts as per the [facility] and [place] DHHS, and also gave patient the COVID packet with this information.  Patient/parent/caregiver has been advised to return to the [facility] or PCP in 3-5 days if no better; to PCP or the Emergency Department if new signs and symptoms develop, or if the current signs or symptoms continue to change or worsen for further workup, evaluation and treatment as clinically indicated and appropriate  The patient will follow up with their current PCP if and as advised. If the patient does not currently have a PCP we will assist them in obtaining one.   The patient may need specialty follow up if the symptoms continue, in spite of conservative treatment and management, for further workup, evaluation, consultation and treatment as clinically indicated and appropriate.  Patient/parent/caregiver verbalized understanding and agreement of plan as discussed.  All questions were addressed during visit.  Please see discharge instructions below for further details of plan.  Discharge Instructions:   Discharge Instructions      The urinalysis that we performed in the clinic today was normal.  Because you are having symptoms, urine culture will be performed.  The results of the urine culture will be available in the next 3 to 5 days and will be posted to your MyChart account.  If there is an abnormal finding, you will be contacted by phone and advised of further treatment recommendations, if any.   For the infection in your right ear, I have prescribed you an antibiotic called doxycycline, please take 1 tablet twice daily for a full 10 days.  Incomplete antibiotic therapy can result in worsening infection and a serious threat to your health.  For both ears, because they have been itchy and bothering you, I  have also provided you with eardrops that you will put in your ears twice daily for the next 7 days.  Please instill 4 drops with each dose.  Again, please use for a full 7 days for best results.  Thank you for visiting urgent care today.  I appreciate the opportunity to participate in your care.    This office note has been dictated using Teaching laboratory technician.  Unfortunately, and despite my best efforts, this method of dictation can sometimes lead to occasional typographical or grammatical errors.  I apologize in advance if this occurs.     Theadora Rama Scales, PA-C 03/14/21 1418

## 2021-03-14 NOTE — ED Triage Notes (Signed)
Pt presents with bilateral ear pain, sore throat, and headache for past few days.

## 2021-03-14 NOTE — Discharge Instructions (Addendum)
The urinalysis that we performed in the clinic today was normal.  Because you are having symptoms, urine culture will be performed.  The results of the urine culture will be available in the next 3 to 5 days and will be posted to your MyChart account.  If there is an abnormal finding, you will be contacted by phone and advised of further treatment recommendations, if any.   For the infection in your right ear, I have prescribed you an antibiotic called doxycycline, please take 1 tablet twice daily for a full 10 days.  Incomplete antibiotic therapy can result in worsening infection and a serious threat to your health.  For both ears, because they have been itchy and bothering you, I have also provided you with eardrops that you will put in your ears twice daily for the next 7 days.  Please instill 4 drops with each dose.  Again, please use for a full 7 days for best results.  Thank you for visiting urgent care today.  I appreciate the opportunity to participate in your care.

## 2021-03-15 LAB — URINE CULTURE

## 2021-03-16 LAB — CULTURE, GROUP A STREP (THRC)

## 2021-05-17 ENCOUNTER — Ambulatory Visit: Payer: 59 | Admitting: Surgery

## 2021-05-17 ENCOUNTER — Inpatient Hospital Stay (HOSPITAL_COMMUNITY): Admission: RE | Admit: 2021-05-17 | Payer: 59 | Source: Ambulatory Visit

## 2021-08-16 ENCOUNTER — Ambulatory Visit (INDEPENDENT_AMBULATORY_CARE_PROVIDER_SITE_OTHER): Payer: Self-pay | Admitting: Family Medicine

## 2021-08-30 ENCOUNTER — Ambulatory Visit (INDEPENDENT_AMBULATORY_CARE_PROVIDER_SITE_OTHER): Payer: Self-pay | Admitting: Family Medicine

## 2021-09-15 ENCOUNTER — Encounter (INDEPENDENT_AMBULATORY_CARE_PROVIDER_SITE_OTHER): Payer: Self-pay

## 2022-06-08 IMAGING — DX DG KNEE COMPLETE 4+V*L*
4 series · 4 of 4 positions shown · non-contrast
Comparison: None.

CLINICAL DATA: 54-year-old female with knee pain

EXAM:
LEFT KNEE - COMPLETE 4+ VIEW

[knee pa]
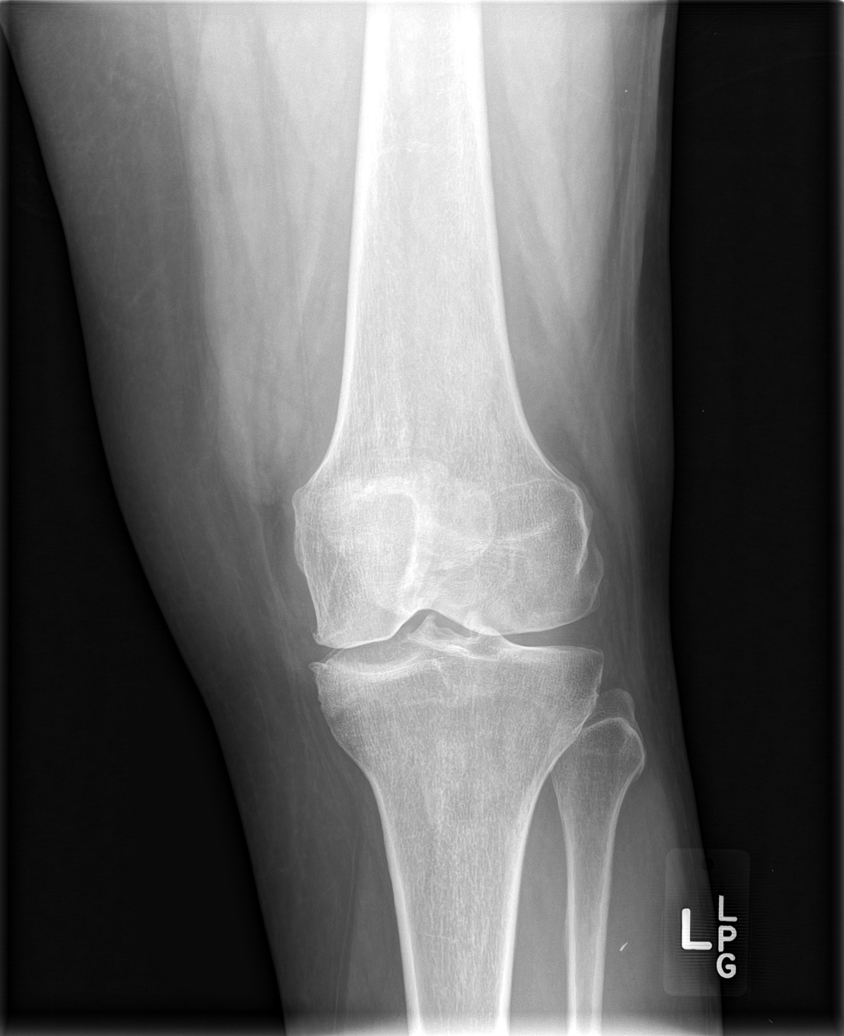

[knee obl (1 of 2)]
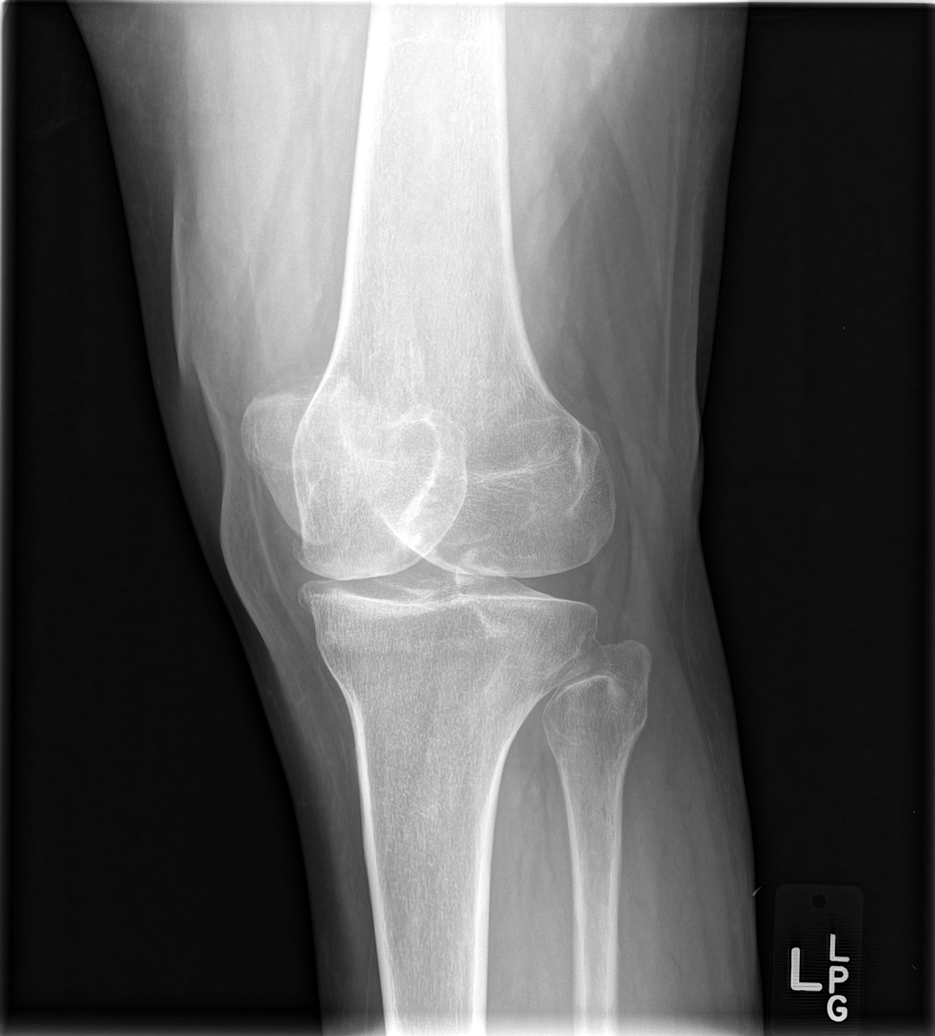

[knee obl (2 of 2)]
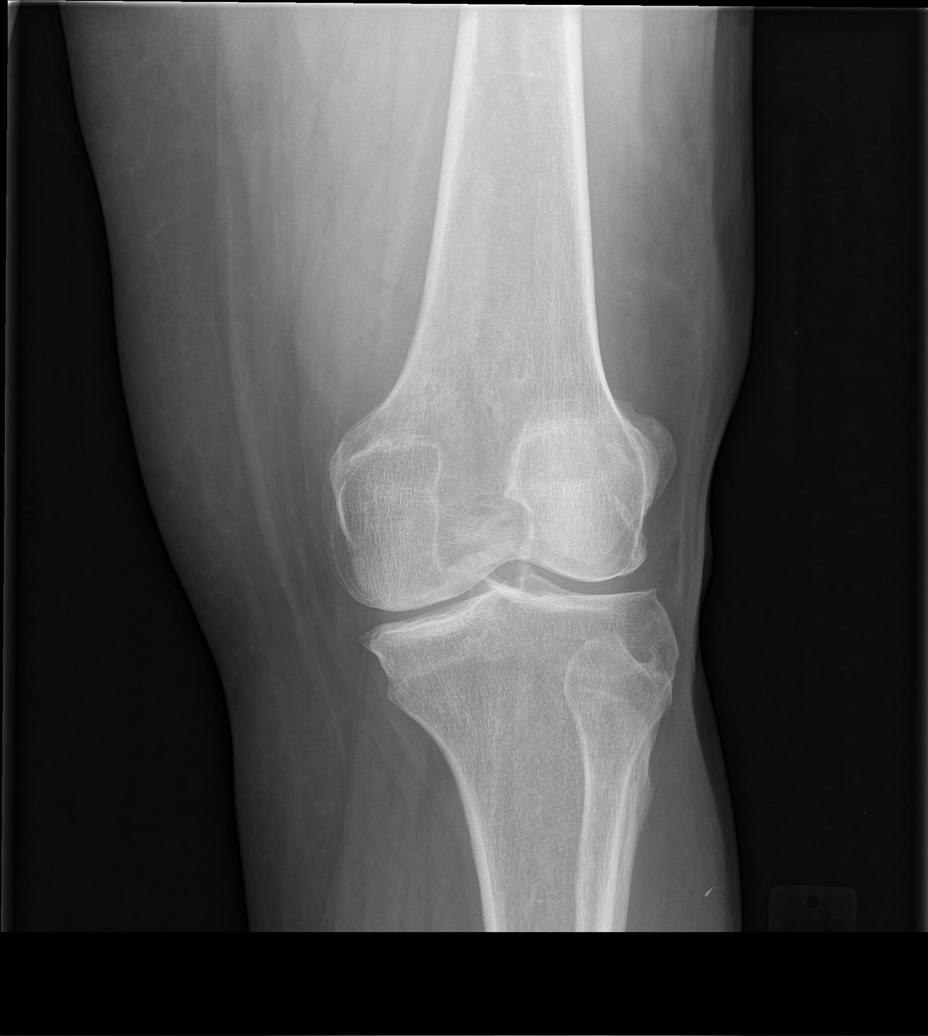

[knee lat]
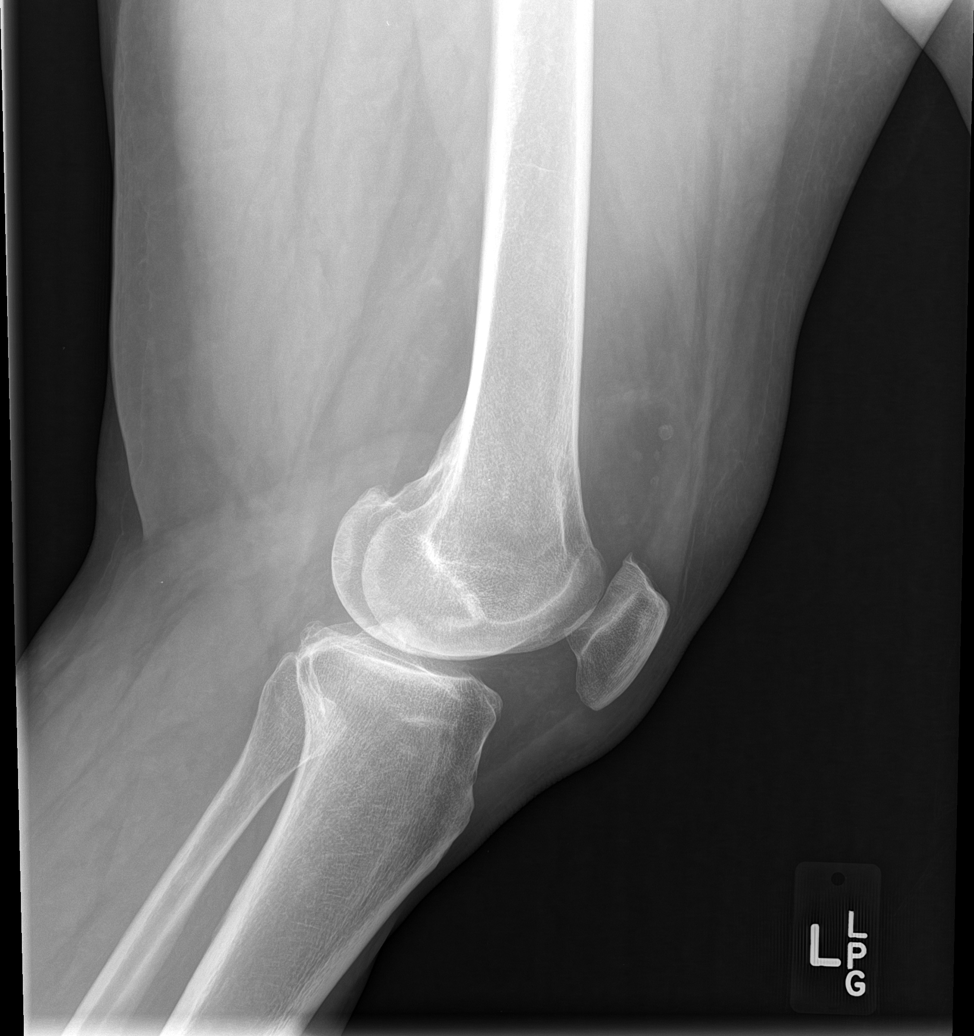

[4 of 4 positions shown; findings below may reference images not displayed]

FINDINGS: No acute displaced fracture. Medial greater than lateral joint space
narrowing with marginal osteophyte formation. Degenerative changes
at the patellofemoral joint. No joint effusion.
IMPRESSION: Negative for acute bony abnormality.

Tricompartmental osteoarthritis
